# Patient Record
Sex: Female | Born: 1980 | Race: White | Hispanic: No | Marital: Married | Smoking: Current every day smoker
Health system: Southern US, Community
[De-identification: ages and names within clinical notes are randomized; demographics above are authoritative.]

## PROBLEM LIST (undated history)

## (undated) DIAGNOSIS — K8020 Calculus of gallbladder without cholecystitis without obstruction: Secondary | ICD-10-CM

## (undated) DIAGNOSIS — D649 Anemia, unspecified: Secondary | ICD-10-CM

## (undated) DIAGNOSIS — O009 Unspecified ectopic pregnancy without intrauterine pregnancy: Secondary | ICD-10-CM

## (undated) HISTORY — PX: CHOLECYSTECTOMY: SHX55

## (undated) HISTORY — DX: Calculus of gallbladder without cholecystitis without obstruction: K80.20

## (undated) HISTORY — PX: OTHER SURGICAL HISTORY: SHX169

## (undated) HISTORY — PX: TUBAL LIGATION: SHX77

---

## 2004-07-09 ENCOUNTER — Emergency Department: Payer: Self-pay | Admitting: Unknown Physician Specialty

## 2004-09-14 ENCOUNTER — Emergency Department: Payer: Self-pay | Admitting: Emergency Medicine

## 2005-01-26 ENCOUNTER — Emergency Department: Payer: Self-pay | Admitting: Unknown Physician Specialty

## 2005-05-11 ENCOUNTER — Emergency Department: Payer: Self-pay | Admitting: Emergency Medicine

## 2005-06-04 ENCOUNTER — Emergency Department: Payer: Self-pay | Admitting: General Practice

## 2005-08-10 ENCOUNTER — Emergency Department: Payer: Self-pay | Admitting: Emergency Medicine

## 2005-08-31 ENCOUNTER — Emergency Department: Payer: Self-pay | Admitting: Emergency Medicine

## 2005-10-24 ENCOUNTER — Emergency Department: Payer: Self-pay | Admitting: Emergency Medicine

## 2006-01-22 ENCOUNTER — Emergency Department: Payer: Self-pay | Admitting: Emergency Medicine

## 2006-02-16 ENCOUNTER — Emergency Department: Payer: Self-pay | Admitting: Emergency Medicine

## 2006-04-23 ENCOUNTER — Emergency Department: Payer: Self-pay | Admitting: Emergency Medicine

## 2006-05-18 ENCOUNTER — Emergency Department: Payer: Self-pay

## 2006-06-19 ENCOUNTER — Emergency Department: Payer: Self-pay | Admitting: Unknown Physician Specialty

## 2006-06-28 ENCOUNTER — Emergency Department: Payer: Self-pay | Admitting: Emergency Medicine

## 2006-07-04 ENCOUNTER — Emergency Department: Payer: Self-pay | Admitting: Emergency Medicine

## 2006-09-22 ENCOUNTER — Emergency Department: Payer: Self-pay | Admitting: Emergency Medicine

## 2007-01-31 ENCOUNTER — Emergency Department: Payer: Self-pay | Admitting: Emergency Medicine

## 2007-02-04 ENCOUNTER — Ambulatory Visit: Payer: Self-pay | Admitting: Emergency Medicine

## 2007-07-14 ENCOUNTER — Emergency Department: Payer: Self-pay | Admitting: Emergency Medicine

## 2008-04-02 ENCOUNTER — Emergency Department: Payer: Self-pay | Admitting: Emergency Medicine

## 2008-05-27 ENCOUNTER — Emergency Department: Payer: Self-pay | Admitting: Emergency Medicine

## 2008-05-27 ENCOUNTER — Emergency Department: Payer: Self-pay | Admitting: Internal Medicine

## 2008-09-26 ENCOUNTER — Emergency Department: Payer: Self-pay | Admitting: Emergency Medicine

## 2008-10-21 ENCOUNTER — Emergency Department: Payer: Self-pay | Admitting: Emergency Medicine

## 2008-10-28 ENCOUNTER — Emergency Department: Payer: Self-pay | Admitting: Emergency Medicine

## 2009-03-19 ENCOUNTER — Emergency Department: Payer: Self-pay | Admitting: Emergency Medicine

## 2009-05-03 ENCOUNTER — Emergency Department: Payer: Self-pay | Admitting: Emergency Medicine

## 2009-07-15 ENCOUNTER — Emergency Department: Payer: Self-pay | Admitting: Emergency Medicine

## 2009-09-06 ENCOUNTER — Emergency Department: Payer: Self-pay | Admitting: Emergency Medicine

## 2009-11-04 ENCOUNTER — Emergency Department: Payer: Self-pay | Admitting: Emergency Medicine

## 2009-12-13 ENCOUNTER — Emergency Department: Payer: Self-pay | Admitting: Internal Medicine

## 2010-03-30 ENCOUNTER — Emergency Department: Payer: Self-pay | Admitting: Emergency Medicine

## 2010-04-17 ENCOUNTER — Emergency Department: Payer: Self-pay | Admitting: Emergency Medicine

## 2010-05-04 ENCOUNTER — Emergency Department: Payer: Self-pay | Admitting: Unknown Physician Specialty

## 2010-05-12 ENCOUNTER — Emergency Department: Payer: Self-pay | Admitting: Emergency Medicine

## 2010-06-01 ENCOUNTER — Emergency Department: Payer: Self-pay | Admitting: Emergency Medicine

## 2010-06-07 ENCOUNTER — Emergency Department: Payer: Self-pay | Admitting: Emergency Medicine

## 2010-07-15 ENCOUNTER — Emergency Department: Payer: Self-pay | Admitting: Emergency Medicine

## 2010-10-19 ENCOUNTER — Emergency Department: Payer: Self-pay | Admitting: Emergency Medicine

## 2011-03-04 ENCOUNTER — Emergency Department: Payer: Self-pay | Admitting: Emergency Medicine

## 2011-03-11 ENCOUNTER — Emergency Department: Payer: Self-pay | Admitting: Emergency Medicine

## 2011-03-20 ENCOUNTER — Emergency Department: Payer: Self-pay | Admitting: Emergency Medicine

## 2011-06-19 ENCOUNTER — Emergency Department: Payer: Self-pay | Admitting: Emergency Medicine

## 2011-09-06 ENCOUNTER — Emergency Department: Payer: Self-pay | Admitting: Emergency Medicine

## 2011-09-22 ENCOUNTER — Emergency Department: Payer: Self-pay | Admitting: Emergency Medicine

## 2011-09-22 LAB — CBC
HCT: 35.6 % (ref 35.0–47.0)
MCH: 25.2 pg — ABNORMAL LOW (ref 26.0–34.0)
MCV: 77 fL — ABNORMAL LOW (ref 80–100)
Platelet: 322 10*3/uL (ref 150–440)
RDW: 15.6 % — ABNORMAL HIGH (ref 11.5–14.5)
WBC: 12.1 10*3/uL — ABNORMAL HIGH (ref 3.6–11.0)

## 2011-09-22 LAB — COMPREHENSIVE METABOLIC PANEL
Bilirubin,Total: 0.3 mg/dL (ref 0.2–1.0)
Calcium, Total: 9.1 mg/dL (ref 8.5–10.1)
Chloride: 103 mmol/L (ref 98–107)
Co2: 26 mmol/L (ref 21–32)
Creatinine: 0.83 mg/dL (ref 0.60–1.30)
EGFR (Non-African Amer.): >60
SGOT(AST): 15 U/L (ref 15–37)
SGPT (ALT): 15 U/L

## 2012-02-09 ENCOUNTER — Emergency Department: Payer: Self-pay | Admitting: Emergency Medicine

## 2012-02-29 ENCOUNTER — Emergency Department: Payer: Self-pay | Admitting: Internal Medicine

## 2012-05-03 ENCOUNTER — Emergency Department: Payer: Self-pay | Admitting: Emergency Medicine

## 2012-05-03 LAB — CBC
HCT: 34.6 % — ABNORMAL LOW (ref 35.0–47.0)
HGB: 11 g/dL — ABNORMAL LOW (ref 12.0–16.0)
MCHC: 31.8 g/dL — ABNORMAL LOW (ref 32.0–36.0)
Platelet: 298 10*3/uL (ref 150–440)
RBC: 4.55 10*6/uL (ref 3.80–5.20)
WBC: 10.9 10*3/uL (ref 3.6–11.0)

## 2012-05-03 LAB — COMPREHENSIVE METABOLIC PANEL
Albumin: 3.3 g/dL — ABNORMAL LOW (ref 3.4–5.0)
Alkaline Phosphatase: 120 U/L (ref 50–136)
Anion Gap: 6 — ABNORMAL LOW (ref 7–16)
BUN: 11 mg/dL (ref 7–18)
Calcium, Total: 8.6 mg/dL (ref 8.5–10.1)
Chloride: 110 mmol/L — ABNORMAL HIGH (ref 98–107)
Co2: 26 mmol/L (ref 21–32)
EGFR (African American): >60
Osmolality: 282 (ref 275–301)
Potassium: 3.8 mmol/L (ref 3.5–5.1)
SGOT(AST): 15 U/L (ref 15–37)
Sodium: 142 mmol/L (ref 136–145)

## 2012-06-15 ENCOUNTER — Emergency Department: Payer: Self-pay | Admitting: Emergency Medicine

## 2012-07-27 ENCOUNTER — Emergency Department: Payer: Self-pay | Admitting: Emergency Medicine

## 2012-08-17 ENCOUNTER — Emergency Department: Payer: Self-pay | Admitting: Emergency Medicine

## 2013-02-06 ENCOUNTER — Emergency Department: Payer: Self-pay

## 2013-02-21 ENCOUNTER — Emergency Department: Payer: Self-pay | Admitting: Emergency Medicine

## 2013-02-21 LAB — BASIC METABOLIC PANEL
BUN: 13 mg/dL (ref 7–18)
Chloride: 106 mmol/L (ref 98–107)
Co2: 26 mmol/L (ref 21–32)
Creatinine: 1.03 mg/dL (ref 0.60–1.30)
EGFR (African American): >60
Glucose: 97 mg/dL (ref 65–99)
Potassium: 4.2 mmol/L (ref 3.5–5.1)
Sodium: 140 mmol/L (ref 136–145)

## 2013-02-21 LAB — CBC
HGB: 11.1 g/dL — ABNORMAL LOW (ref 12.0–16.0)
MCH: 23.9 pg — ABNORMAL LOW (ref 26.0–34.0)
MCHC: 32.2 g/dL (ref 32.0–36.0)
Platelet: 294 10*3/uL (ref 150–440)
RBC: 4.67 10*6/uL (ref 3.80–5.20)
WBC: 11 10*3/uL (ref 3.6–11.0)

## 2013-02-21 LAB — TROPONIN I: Troponin-I: <0.02 ng/mL

## 2013-08-11 ENCOUNTER — Emergency Department: Payer: Self-pay | Admitting: Emergency Medicine

## 2013-09-14 ENCOUNTER — Emergency Department: Payer: Self-pay | Admitting: Emergency Medicine

## 2013-09-21 ENCOUNTER — Emergency Department: Payer: Self-pay | Admitting: Emergency Medicine

## 2013-11-01 ENCOUNTER — Emergency Department: Payer: Self-pay | Admitting: Emergency Medicine

## 2013-11-04 LAB — BETA STREP CULTURE(ARMC)

## 2014-02-24 ENCOUNTER — Emergency Department: Payer: Self-pay | Admitting: Emergency Medicine

## 2014-04-30 ENCOUNTER — Emergency Department: Payer: Self-pay | Admitting: Emergency Medicine

## 2014-07-06 ENCOUNTER — Emergency Department: Payer: Self-pay | Admitting: Emergency Medicine

## 2014-11-25 ENCOUNTER — Emergency Department
Admit: 2014-11-25 | Disposition: A | Discharge status: Home / Self Care | Payer: Self-pay | Admitting: Emergency Medicine

## 2014-12-04 ENCOUNTER — Emergency Department
Admit: 2014-12-04 | Disposition: A | Discharge status: Home / Self Care | Payer: Self-pay | Admitting: Emergency Medicine

## 2015-03-09 ENCOUNTER — Emergency Department
Admission: EM | Admit: 2015-03-09 | Discharge: 2015-03-09 | Disposition: A | Discharge status: Home / Self Care | Payer: Self-pay | Attending: Student | Admitting: Student

## 2015-03-09 DIAGNOSIS — B353 Tinea pedis: Secondary | ICD-10-CM | POA: Insufficient documentation

## 2015-03-09 MED ORDER — CLOTRIMAZOLE 1 % VA CREA: TOPICAL_CREAM | VAGINAL | Status: DC

## 2015-03-09 NOTE — ED Provider Notes (Signed)
[facility] Emergency Department Provider Note  ____________________________________________  Time seen: Approximately 6:06 AM  I have reviewed the triage vital signs and the nursing notes.   HISTORY  Chief Complaint Foot Pain    HPI Monique Jacobs is a 34 y.o. female with history of fibromyalgia presents for evaluation of pain and itching between her toes on her left foot. This has been ongoing for 1 week. It is had gradual onset, has been constant since onset. She thinks it may be athlete's foot as she has had this in the past. She has been using an over-the-counter cream for athlete's foot for the past 2 days however she reports no improvement in her symptoms. She denies any injury to the foot. Current severity of symptoms is moderate. No modifying factors. No associated symptoms.   No past medical history on file.  There are no active problems to display for this patient.   No past surgical history on file.  No current outpatient prescriptions on file.  Allergies Vicodin  No family history on file.  Social History History  Substance Use Topics  . Smoking status: Not on file  . Smokeless tobacco: Not on file  . Alcohol Use: Not on file    Review of Systems Constitutional: No fever/chills Eyes: No visual changes. ENT: No sore throat. Cardiovascular: Denies chest pain. Respiratory: Denies shortness of breath. Gastrointestinal: No abdominal pain.  No nausea, no vomiting.  No diarrhea.  No constipation. Genitourinary: Negative for dysuria. Musculoskeletal: Negative for back pain. Skin: Negative for rash. Neurological: Negative for headaches, focal weakness or numbness.  10-point ROS otherwise negative.  ____________________________________________   PHYSICAL EXAM:  VITAL SIGNS: ED Triage Vitals  Enc Vitals Group     BP 03/09/15 0532 142/62 mmHg     Pulse --      Resp 03/09/15 0532 20     Temp 03/09/15 0532 98 F (36.7 C)   Temp Source 03/09/15 0532 Oral     SpO2 03/09/15 0532 100 %     Weight 03/09/15 0532 240 lb (108.863 kg)     Height 03/09/15 0532 4\' 11"  (1.499 m)     Head Cir --      Peak Flow --      Pain Score 03/09/15 0533 7     Pain Loc --      Pain Edu? --      Excl. in GC? --     Constitutional: Alert and oriented. Well appearing and in no acute distress. Eyes: Conjunctivae are normal. PERRL. EOMI. Head: Atraumatic. Nose: No congestion/rhinnorhea. Mouth/Throat: Mucous membranes are moist.  Oropharynx non-erythematous. Neck: No stridor. Cardiovascular: Normal rate, regular rhythm. Grossly normal heart sounds.  Good peripheral circulation. 2+ DP pulses bilaterally. Respiratory: Normal respiratory effort.  No retractions. Lungs CTAB. Gastrointestinal: Soft and nontender. No distention. No abdominal bruits. No CVA tenderness. Genitourinary: deferred Musculoskeletal: No lower extremity tenderness nor edema.  No joint effusions. Neurologic:  Normal speech and language. No gross focal neurologic deficits are appreciated. No gait instability. Skin:  Areas of minor scaling in between all toes on the left foot Psychiatric: Mood and affect are normal. Speech and behavior are normal.  ____________________________________________   LABS (all labs ordered are listed, but only abnormal results are displayed)  Labs Reviewed - No data to display ____________________________________________  EKG  none ____________________________________________  RADIOLOGY  none ____________________________________________   PROCEDURES  Procedure(s) performed: None  Critical Care performed: No  ____________________________________________   INITIAL IMPRESSION /  ASSESSMENT AND PLAN / ED COURSE  Pertinent labs & imaging results that were available during my care of the patient were reviewed by me and considered in my medical decision making (see chart for details).  Monique Jacobs is a 34 y.o. female  with history of fibromyalgia presents for evaluation of pain and itching between her toes on her left foot. History and physical consistent with very mild athlete's foot/tinea pedis. No evidence to suggest superinfection. No neurovascular compromise and her exam is otherwise unremarkable. We'll discharge with topical clotrimazole, return precautions, close PCP follow-up. ____________________________________________   FINAL CLINICAL IMPRESSION(S) / ED DIAGNOSES  Final diagnoses:  Athlete's foot on left      Monique Doss, MD 03/09/15 [phone]

## 2015-03-09 NOTE — ED Notes (Signed)
Pt alert and in NAD at time of d/c to [facility] via w/c

## 2015-03-09 NOTE — ED Notes (Signed)
Pt c/o pain and itching to her left foot; says she has a small discolored area between her 2nd and 3rd digits; thinks it may be athlete's foot

## 2015-03-25 ENCOUNTER — Emergency Department: Payer: Self-pay

## 2015-03-25 ENCOUNTER — Emergency Department
Admission: EM | Admit: 2015-03-25 | Discharge: 2015-03-25 | Disposition: A | Discharge status: Home / Self Care | Payer: Self-pay | Attending: Emergency Medicine | Admitting: Emergency Medicine

## 2015-03-25 ENCOUNTER — Encounter: Payer: Self-pay | Admitting: Emergency Medicine

## 2015-03-25 DIAGNOSIS — K8020 Calculus of gallbladder without cholecystitis without obstruction: Secondary | ICD-10-CM | POA: Insufficient documentation

## 2015-03-25 DIAGNOSIS — Z720 Tobacco use: Secondary | ICD-10-CM | POA: Insufficient documentation

## 2015-03-25 DIAGNOSIS — R1011 Right upper quadrant pain: Secondary | ICD-10-CM

## 2015-03-25 HISTORY — DX: Unspecified ectopic pregnancy without intrauterine pregnancy: O00.90

## 2015-03-25 HISTORY — DX: Anemia, unspecified: D64.9

## 2015-03-25 LAB — URINALYSIS COMPLETE WITH MICROSCOPIC (ARMC ONLY)
BILIRUBIN URINE: NEGATIVE
GLUCOSE, UA: NEGATIVE mg/dL
HGB URINE DIPSTICK: NEGATIVE
KETONES UR: NEGATIVE mg/dL
Leukocytes, UA: NEGATIVE
Nitrite: NEGATIVE
PH: 6 (ref 5.0–8.0)
Protein, ur: NEGATIVE mg/dL
Specific Gravity, Urine: 1.01 (ref 1.005–1.030)

## 2015-03-25 LAB — COMPREHENSIVE METABOLIC PANEL
ALT: 10 U/L — ABNORMAL LOW (ref 14–54)
AST: 15 U/L (ref 15–41)
Albumin: 3.4 g/dL — ABNORMAL LOW (ref 3.5–5.0)
Alkaline Phosphatase: 108 U/L (ref 38–126)
Anion gap: 6 (ref 5–15)
BUN: 10 mg/dL (ref 6–20)
CO2: 27 mmol/L (ref 22–32)
CREATININE: 0.97 mg/dL (ref 0.44–1.00)
Calcium: 8.7 mg/dL — ABNORMAL LOW (ref 8.9–10.3)
Chloride: 100 mmol/L — ABNORMAL LOW (ref 101–111)
GFR calc Af Amer: >60 mL/min (ref >60)
GFR calc non Af Amer: >60 mL/min (ref >60)
Glucose, Bld: 111 mg/dL — ABNORMAL HIGH (ref 65–99)
Potassium: 4 mmol/L (ref 3.5–5.1)
Sodium: 133 mmol/L — ABNORMAL LOW (ref 135–145)
TOTAL PROTEIN: 7.1 g/dL (ref 6.5–8.1)
Total Bilirubin: <0.1 mg/dL — ABNORMAL LOW (ref 0.3–1.2)

## 2015-03-25 LAB — CBC
HEMATOCRIT: 30.2 % — AB (ref 35.0–47.0)
Hemoglobin: 9.4 g/dL — ABNORMAL LOW (ref 12.0–16.0)
MCH: 20.7 pg — AB (ref 26.0–34.0)
MCHC: 31.3 g/dL — ABNORMAL LOW (ref 32.0–36.0)
MCV: 66.3 fL — ABNORMAL LOW (ref 80.0–100.0)
Platelets: 353 10*3/uL (ref 150–440)
RBC: 4.55 MIL/uL (ref 3.80–5.20)
RDW: 20.1 % — ABNORMAL HIGH (ref 11.5–14.5)
WBC: 10 10*3/uL (ref 3.6–11.0)

## 2015-03-25 MED ORDER — OXYCODONE-ACETAMINOPHEN 5-325 MG PO TABS: 1.0000 | ORAL_TABLET | ORAL | Status: DC | PRN

## 2015-03-25 MED ORDER — ONDANSETRON HCL 4 MG PO TABS: 4.0000 mg | ORAL_TABLET | Freq: Every day | ORAL | Status: DC | PRN

## 2015-03-25 NOTE — ED Notes (Signed)
Pt reports R sided flank pain that started about 1 month ago.  Pt also reports not having normal bowel movements (only going once or twice every two weeks).  Pt visited her PCP and had ultrasound scheduled, but not done yet.  Pt reports pain increasing this morning when she woke up, despite taking percocet.

## 2015-03-25 NOTE — ED Notes (Signed)
NAD noted at time of D/C. Pt denies questions or concerns. Pt ambulatory to the lobby at this time.  

## 2015-03-25 NOTE — Discharge Instructions (Signed)
Please seek medical attention for any high fevers, chest pain, shortness of breath, change in behavior, persistent vomiting, bloody stool or any other new or concerning symptoms. ° °Cholelithiasis °Cholelithiasis (also called gallstones) is a form of gallbladder disease in which gallstones form in your gallbladder. The gallbladder is an organ that stores bile made in the liver, which helps digest fats. Gallstones begin as small crystals and slowly grow into stones. Gallstone pain occurs when the gallbladder spasms and a gallstone is blocking the duct. Pain can also occur when a stone passes out of the duct.  °RISK FACTORS °· Being female.   °· Having multiple pregnancies. Health care providers sometimes advise removing diseased gallbladders before future pregnancies.   °· Being obese. °· Eating a diet heavy in fried foods and fat.   °· Being older than 60 years and increasing age.   °· Prolonged use of medicines containing female hormones.   °· Having diabetes mellitus.   °· Rapidly losing weight.   °· Having a family history of gallstones (heredity).   °SYMPTOMS °· Nausea.   °· Vomiting. °· Abdominal pain.   °· Yellowing of the skin (jaundice).   °· Sudden pain. It may persist from several minutes to several hours. °· Fever.   °· Tenderness to the touch.  °In some cases, when gallstones do not move into the bile duct, people have no pain or symptoms. These are called "silent" gallstones.  °TREATMENT °Silent gallstones do not need treatment. In severe cases, emergency surgery may be required. Options for treatment include: °· Surgery to remove the gallbladder. This is the most common treatment. °· Medicines. These do not always work and may take 6-12 months or more to work. °· Shock wave treatment (extracorporeal biliary lithotripsy). In this treatment an ultrasound machine sends shock waves to the gallbladder to break gallstones into smaller pieces that can pass into the intestines or be dissolved by  medicine. °HOME CARE INSTRUCTIONS  °· Only take over-the-counter or prescription medicines for pain, discomfort, or fever as directed by your health care provider.   °· Follow a low-fat diet until seen again by your health care provider. Fat causes the gallbladder to contract, which can result in pain.   °· Follow up with your health care provider as directed. Attacks are almost always recurrent and surgery is usually required for permanent treatment.   °SEEK IMMEDIATE MEDICAL CARE IF:  °· Your pain increases and is not controlled by medicines.   °· You have a fever or persistent symptoms for more than 2-3 days.   °· You have a fever and your symptoms suddenly get worse.   °· You have persistent nausea and vomiting.   °MAKE SURE YOU:  °· Understand these instructions. °· Will watch your condition. °· Will get help right away if you are not doing well or get worse. °Document Released: 08/04/2005 Document Revised: 04/10/2013 Document Reviewed: 01/30/2013 °Exit[employer]® Patient Information ©2015 [employer]. This information is not intended to replace advice given to you by your health care provider. Make sure you discuss any questions you have with your health care provider. ° °

## 2015-03-25 NOTE — ED Notes (Signed)
Pt here with right sided abdominal pain, reports seen by PCP and they were going to schedule [place], if pain got worse, was told to come here.

## 2015-03-25 NOTE — ED Provider Notes (Signed)
[facility] Emergency Department Provider Note    ____________________________________________  Time seen: 1400  I have reviewed the triage vital signs and the nursing notes.   HISTORY  Chief Complaint Flank Pain   History limited by: Not Limited   HPI Monique Jacobs is a 34 y.o. female who presents to the emergency department today because of concerns for continued right-sided pain. She states the pain is been going on for 1 month. Has been constant. It is worse with walking. It is located in the right flank. It is sharp and old. She does have associated nausea. She hasn't additionally has associated difficulty with defecation. She has not noticed any bloody stool. No fevers She has seen her primary care provider who was going to order an ultrasound for her however this is not it happened.     Past Medical History  Diagnosis Date  . Ectopic pregnancy   . Anemia     There are no active problems to display for this patient.   Past Surgical History  Procedure Laterality Date  . Tubal ligation    . Cessariatn section      Current Outpatient Rx  Name  Route  Sig  Dispense  Refill  . clotrimazole (GYNE-LOTRIMIN) 1 % vaginal cream      Apply clotrimazole 1% cream to affected areas (web spaces/between toes) twice daily for 4 weeks for treatment of athlete's foot. Pharmacist dispense QS. No refills.   45 g   0     Allergies Bactrim and Vicodin  No family history on file.  Social History History  Substance Use Topics  . Smoking status: Current Every Day Smoker -- 1.00 packs/day    Types: Cigarettes  . Smokeless tobacco: Not on file  . Alcohol Use: 0.6 oz/week    1 Standard drinks or equivalent per week    Review of Systems  Constitutional: Negative for fever. Cardiovascular: Negative for chest pain. Respiratory: Negative for shortness of breath. Gastrointestinal: Right-sided abdominal pain plus nausea Genitourinary: Negative for  dysuria. Musculoskeletal: Negative for back pain. Skin: Negative for rash. Neurological: Negative for headaches, focal weakness or numbness.   10-point ROS otherwise negative.  ____________________________________________   PHYSICAL EXAM:  VITAL SIGNS: ED Triage Vitals  Enc Vitals Group     BP 03/25/15 1231 102/59 mmHg     Pulse Rate 03/25/15 1231 93     Resp 03/25/15 1231 20     Temp 03/25/15 1231 97.7 F (36.5 C)     Temp Source 03/25/15 1231 Oral     SpO2 03/25/15 1231 100 %     Weight 03/25/15 1231 235 lb (106.595 kg)     Height 03/25/15 1231 4\' 11"  (1.499 m)     Head Cir --      Peak Flow --      Pain Score 03/25/15 1231 5   Constitutional: Alert and oriented. Well appearing and in no distress. Eyes: Conjunctivae are normal. PERRL. Normal extraocular movements. ENT   Head: Normocephalic and atraumatic.   Nose: No congestion/rhinnorhea.   Mouth/Throat: Mucous membranes are moist.   Neck: No stridor. Cardiovascular: Normal rate, regular rhythm.   Respiratory: Normal respiratory effort without tachypnea nor retractions.  Gastrointestinal: Soft and moderately tender in the right upper quadrant. No rebound. No guarding. Genitourinary: Deferred Musculoskeletal: Normal range of motion in all extremities. No joint effusions.  No lower extremity tenderness nor edema. Neurologic:  Normal speech and language. No gross focal neurologic deficits are appreciated. Speech  is normal.  Skin:  Skin is warm, dry and intact. No rash noted. Psychiatric: Mood and affect are normal. Speech and behavior are normal. Patient exhibits appropriate insight and judgment.  ____________________________________________    LABS (pertinent positives/negatives)  Labs Reviewed  COMPREHENSIVE METABOLIC PANEL - Abnormal; Notable for the following:    Sodium 133 (*)    Chloride 100 (*)    Glucose, Bld 111 (*)    Calcium 8.7 (*)    Albumin 3.4 (*)    ALT 10 (*)    Total Bilirubin  <0.1 (*)    All other components within normal limits  CBC - Abnormal; Notable for the following:    Hemoglobin 9.4 (*)    HCT 30.2 (*)    MCV 66.3 (*)    MCH 20.7 (*)    MCHC 31.3 (*)    RDW 20.1 (*)    All other components within normal limits  URINALYSIS COMPLETEWITH MICROSCOPIC ([facility] ONLY) - Abnormal; Notable for the following:    Color, Urine YELLOW (*)    APPearance CLEAR (*)    Bacteria, UA RARE (*)    Squamous Epithelial / LPF 0-5 (*)    All other components within normal limits     ____________________________________________   EKG  None  ____________________________________________    RADIOLOGY  RUQ US  IMPRESSION: Cholelithiasis. Difficult to confirm or exclude acute cholecystitis, but no pericholecystic fluid or gallbladder wall thickening.  Hepatic steatosis.  ____________________________________________   PROCEDURES  Procedure(s) performed: None  Critical Care performed: No  ____________________________________________   INITIAL IMPRESSION / ASSESSMENT AND PLAN / ED COURSE  Pertinent labs & imaging results that were available during my care of the patient were reviewed by me and considered in my medical decision making (see chart for details).  Patient presents to the emergency department today with right-sided abdominal pain. Ultrasound does show gallstones without any signs of acute cholecystitis. Blood work without any concerning findings. Will discharge home with surgery follow-up.  ____________________________________________   FINAL CLINICAL IMPRESSION(S) / ED DIAGNOSES  Cholelithiasis  Phineas Semen, MD 03/25/15 1526

## 2015-05-13 ENCOUNTER — Emergency Department
Admission: EM | Admit: 2015-05-13 | Discharge: 2015-05-13 | Disposition: A | Discharge status: Home / Self Care | Payer: Self-pay | Attending: Emergency Medicine | Admitting: Emergency Medicine

## 2015-05-13 DIAGNOSIS — R110 Nausea: Secondary | ICD-10-CM | POA: Insufficient documentation

## 2015-05-13 DIAGNOSIS — K047 Periapical abscess without sinus: Secondary | ICD-10-CM | POA: Insufficient documentation

## 2015-05-13 DIAGNOSIS — K0381 Cracked tooth: Secondary | ICD-10-CM | POA: Insufficient documentation

## 2015-05-13 DIAGNOSIS — Z792 Long term (current) use of antibiotics: Secondary | ICD-10-CM | POA: Insufficient documentation

## 2015-05-13 DIAGNOSIS — K0889 Other specified disorders of teeth and supporting structures: Secondary | ICD-10-CM

## 2015-05-13 DIAGNOSIS — Z79899 Other long term (current) drug therapy: Secondary | ICD-10-CM | POA: Insufficient documentation

## 2015-05-13 DIAGNOSIS — Z720 Tobacco use: Secondary | ICD-10-CM | POA: Insufficient documentation

## 2015-05-13 MED ORDER — CEPHALEXIN 500 MG PO CAPS
ORAL_CAPSULE | ORAL | Status: AC
  Administered 2015-05-13: 500 mg via ORAL
  Filled 2015-05-13: qty 1

## 2015-05-13 MED ORDER — CEPHALEXIN 500 MG PO CAPS: 500.0000 mg | ORAL_CAPSULE | Freq: Four times a day (QID) | ORAL | Status: AC

## 2015-05-13 MED ORDER — CEPHALEXIN 500 MG PO CAPS
500.0000 mg | ORAL_CAPSULE | Freq: Once | ORAL | Status: AC
  Administered 2015-05-13: 500 mg via ORAL

## 2015-05-13 MED ORDER — IBUPROFEN 800 MG PO TABS
800.0000 mg | ORAL_TABLET | Freq: Once | ORAL | Status: AC
Start: 2015-05-13 — End: 2015-05-13
  Administered 2015-05-13: 800 mg via ORAL
  Filled 2015-05-13: qty 1

## 2015-05-13 MED ORDER — TRAMADOL HCL 50 MG PO TABS: 50.0000 mg | ORAL_TABLET | Freq: Four times a day (QID) | ORAL | Status: AC | PRN

## 2015-05-13 NOTE — Discharge Instructions (Signed)
Dental Abscess °A dental abscess is a collection of infected fluid (pus) from a bacterial infection in the inner part of the tooth (pulp). It usually occurs at the end of the tooth's root.  °CAUSES  °· Severe tooth decay. °· Trauma to the tooth that allows bacteria to enter into the pulp, such as a broken or chipped tooth. °SYMPTOMS  °· Severe pain in and around the infected tooth. °· Swelling and redness around the abscessed tooth or in the mouth or face. °· Tenderness. °· Pus drainage. °· Bad breath. °· Bitter taste in the mouth. °· Difficulty swallowing. °· Difficulty opening the mouth. °· Nausea. °· Vomiting. °· Chills. °· Swollen neck glands. °DIAGNOSIS  °· A medical and dental history will be taken. °· An examination will be performed by tapping on the abscessed tooth. °· X-rays may be taken of the tooth to identify the abscess. °TREATMENT °The goal of treatment is to eliminate the infection. You may be prescribed antibiotic medicine to stop the infection from spreading. A root canal may be performed to save the tooth. If the tooth cannot be saved, it may be pulled (extracted) and the abscess may be drained.  °HOME CARE INSTRUCTIONS °· Only take over-the-counter or prescription medicines for pain, fever, or discomfort as directed by your caregiver. °· Rinse your mouth (gargle) often with salt water (¼ tsp salt in 8 oz [250 ml] of warm water) to relieve pain or swelling. °· Do not drive after taking pain medicine (narcotics). °· Do not apply heat to the outside of your face. °· Return to your dentist for further treatment as directed. °SEEK MEDICAL CARE IF: °· Your pain is not helped by medicine. °· Your pain is getting worse instead of better. °SEEK IMMEDIATE MEDICAL CARE IF: °· You have a fever or persistent symptoms for more than 2-3 days. °· You have a fever and your symptoms suddenly get worse. °· You have chills or a very bad headache. °· You have problems breathing or swallowing. °· You have trouble  opening your mouth. °· You have swelling in the neck or around the eye. °Document Released: 08/08/2005 Document Revised: 05/02/2012 Document Reviewed: 11/16/2010 °Exit[employer]® Patient Information ©2015 [employer]. This information is not intended to replace advice given to you by your health care provider. Make sure you discuss any questions you have with your health care provider. °Dental Pain °A tooth ache may be caused by cavities (tooth decay). Cavities expose the nerve of the tooth to air and hot or cold temperatures. It may come from an infection or abscess (also called a boil or furuncle) around your tooth. It is also often caused by dental caries (tooth decay). This causes the pain you are having. °DIAGNOSIS  °Your caregiver can diagnose this problem by exam. °TREATMENT  °· If caused by an infection, it may be treated with medications which kill germs (antibiotics) and pain medications as prescribed by your caregiver. Take medications as directed. °· Only take over-the-counter or prescription medicines for pain, discomfort, or fever as directed by your caregiver. °· Whether the tooth ache today is caused by infection or dental disease, you should see your dentist as soon as possible for further care. °SEEK MEDICAL CARE IF: °The exam and treatment you received today has been provided on an emergency basis only. This is not a substitute for complete medical or dental care. If your problem worsens or new problems (symptoms) appear, and you are unable to meet with your dentist, call or return   to this location. °SEEK IMMEDIATE MEDICAL CARE IF:  °· You have a fever. °· You develop redness and swelling of your face, jaw, or neck. °· You are unable to open your mouth. °· You have severe pain uncontrolled by pain medicine. °MAKE SURE YOU:  °· Understand these instructions. °· Will watch your condition. °· Will get help right away if you are not doing well or get worse. °Document Released: 08/08/2005 Document Revised:  10/31/2011 Document Reviewed: 03/26/2008 °[employer]® Patient Information ©2015 [employer]. This information is not intended to replace advice given to you by your health care provider. Make sure you discuss any questions you have with your health care provider. ° °

## 2015-05-13 NOTE — ED Notes (Signed)
Pt in with co toothache and right sided facial swelling and pain.

## 2015-05-13 NOTE — ED Provider Notes (Signed)
[facility] Emergency Department Provider Note  ____________________________________________  Time seen: Approximately 5:26 AM  I have reviewed the triage vital signs and the nursing notes.   HISTORY  Chief Complaint Dental Pain    HPI Monique Jacobs is a 34 y.o. female who comes into the hospital with a tooth infection. She reports that she has some swelling in her right face. The patient reports that this started at 316 yesterday morning. She reports that she was not able to follow up with her primary care physician because she had to work. She took 2 Percocet and the Tylenol. The patient reports that it helped a little but the swelling has returned. The patient reports that she is also unable to open her mouth again. The patient also reports that she took 2 amoxicillin pills. The patient has a broken tooth on her right maxilla and reports that the tooth is very painful. She has had previously broken teeth which have been removed from her dentist. The patient has not called her dentist but plans to call them today. The patient denies any fever but has some nausea. She reports that she's also had some gallstones in the recent past from which she's had the medication.The patient reports her pain as an 8 out of 10 in intensity.   Past Medical History  Diagnosis Date  . Ectopic pregnancy   . Anemia     There are no active problems to display for this patient.   Past Surgical History  Procedure Laterality Date  . Tubal ligation    . Cessariatn section      Current Outpatient Rx  Name  Route  Sig  Dispense  Refill  . cephALEXin (KEFLEX) 500 MG capsule   Oral   Take 1 capsule (500 mg total) by mouth 4 (four) times daily.   20 capsule   0   . clotrimazole (GYNE-LOTRIMIN) 1 % vaginal cream      Apply clotrimazole 1% cream to affected areas (web spaces/between toes) twice daily for 4 weeks for treatment of athlete's foot. Pharmacist dispense QS. No  refills.   45 g   0   . ondansetron (ZOFRAN) 4 MG tablet   Oral   Take 1 tablet (4 mg total) by mouth daily as needed for nausea or vomiting.   20 tablet   0   . oxyCODONE-acetaminophen (ROXICET) 5-325 MG per tablet   Oral   Take 1 tablet by mouth every 4 (four) hours as needed for severe pain.   15 tablet   0   . traMADol (ULTRAM) 50 MG tablet   Oral   Take 1 tablet (50 mg total) by mouth every 6 (six) hours as needed.   12 tablet   0     Allergies Bactrim and Vicodin  No family history on file.  Social History Social History  Substance Use Topics  . Smoking status: Current Every Day Smoker -- 1.00 packs/day    Types: Cigarettes  . Smokeless tobacco: Not on file  . Alcohol Use: 0.6 oz/week    1 Thermon Zulauf drinks or equivalent per week    Review of Systems Constitutional: No fever/chills Eyes: No visual changes. ENT: Right-sided dental pain and facial swelling Cardiovascular: Denies chest pain. Respiratory: Denies shortness of breath. Gastrointestinal: No abdominal pain.  No nausea, no vomiting.  No diarrhea.  No constipation. Genitourinary: Negative for dysuria. Musculoskeletal: Negative for back pain. Skin: Negative for rash. Neurological: Negative for headaches, focal weakness or numbness.  10-point ROS otherwise negative.  ____________________________________________   PHYSICAL EXAM:  VITAL SIGNS: ED Triage Vitals  Enc Vitals Group     BP 05/13/15 0052 183/55 mmHg     Pulse Rate 05/13/15 0052 89     Resp 05/13/15 0052 18     Temp 05/13/15 0052 97.9 F (36.6 C)     Temp Source 05/13/15 0052 Oral     SpO2 05/13/15 0052 100 %     Weight 05/13/15 0052 235 lb (106.595 kg)     Height 05/13/15 0052  (1.499 m)     Head Cir --      Peak Flow --      Pain Score 05/13/15 0053 8     Pain Loc --      Pain Edu? --      Excl. in GC? --     Constitutional: Alert and oriented. Well appearing and in no acute distress. Eyes: Conjunctivae are normal.  PERRL. EOMI. Head: Atraumatic. Nose: No congestion/rhinnorhea. Mouth/Throat: Broken tooth at the right sided premolar with exquisite tenderness to palpation. Mucous membranes are moist.  Oropharynx non-erythematous. Cardiovascular: Normal rate, regular rhythm. Grossly normal heart sounds.  Good peripheral circulation. Respiratory: Normal respiratory effort.  No retractions. Lungs CTAB. Gastrointestinal: Soft and nontender. No distention. Positive bowel sounds Musculoskeletal: No lower extremity tenderness nor edema. . Neurologic:  Normal speech and language.  Skin:  Skin is warm, dry and intact.  Psychiatric: Mood and affect are normal.   ____________________________________________   LABS (all labs ordered are listed, but only abnormal results are displayed)  Labs Reviewed - No data to display ____________________________________________  EKG  None ____________________________________________  RADIOLOGY  None ____________________________________________   PROCEDURES  Procedure(s) performed: None  Critical Care performed: No  ____________________________________________   INITIAL IMPRESSION / ASSESSMENT AND PLAN / ED COURSE  Pertinent labs & imaging results that were available during my care of the patient were reviewed by me and considered in my medical decision making (see chart for details).  The patient is a 34 year old female who comes in today with dental pain. The patient does have some tenderness to palpation at her right sided premolar. I will give the patient a dose of ibuprofen and she is driving as well as some Keflex. I encouraged the patient to follow-up with her dentist and have her tooth pulled that she is very exquisitely tender on that tooth. The patient has no definitive abscess but does have some mild right-sided facial swelling. I explained this to the patient and she understands plan as stated. ____________________________________________   FINAL  CLINICAL IMPRESSION(S) / ED DIAGNOSES  Final diagnoses:  Pain, dental  Dental abscess      Rebecka Apley, MD 05/13/15 [phone]

## 2015-05-13 NOTE — ED Notes (Signed)
Pt sitting in room with no distress noted; pt reports right upper toothache since yesterday, now with swelling to jaw; rates 5/10 at present but st pain increases with palpation

## 2015-05-27 ENCOUNTER — Encounter: Payer: Self-pay | Admitting: *Deleted

## 2015-05-27 DIAGNOSIS — Z3202 Encounter for pregnancy test, result negative: Secondary | ICD-10-CM | POA: Insufficient documentation

## 2015-05-27 DIAGNOSIS — Z720 Tobacco use: Secondary | ICD-10-CM | POA: Insufficient documentation

## 2015-05-27 DIAGNOSIS — Z79899 Other long term (current) drug therapy: Secondary | ICD-10-CM | POA: Insufficient documentation

## 2015-05-27 DIAGNOSIS — K8050 Calculus of bile duct without cholangitis or cholecystitis without obstruction: Secondary | ICD-10-CM | POA: Insufficient documentation

## 2015-05-27 DIAGNOSIS — E669 Obesity, unspecified: Secondary | ICD-10-CM | POA: Insufficient documentation

## 2015-05-27 DIAGNOSIS — Z791 Long term (current) use of non-steroidal anti-inflammatories (NSAID): Secondary | ICD-10-CM | POA: Insufficient documentation

## 2015-05-27 LAB — CBC
HCT: 33 % — ABNORMAL LOW (ref 35.0–47.0)
Hemoglobin: 10.2 g/dL — ABNORMAL LOW (ref 12.0–16.0)
MCH: 20.4 pg — ABNORMAL LOW (ref 26.0–34.0)
MCHC: 30.9 g/dL — ABNORMAL LOW (ref 32.0–36.0)
MCV: 65.9 fL — AB (ref 80.0–100.0)
PLATELETS: 394 10*3/uL (ref 150–440)
RBC: 5.01 MIL/uL (ref 3.80–5.20)
RDW: 19.4 % — ABNORMAL HIGH (ref 11.5–14.5)
WBC: 14.7 10*3/uL — ABNORMAL HIGH (ref 3.6–11.0)

## 2015-05-27 LAB — COMPREHENSIVE METABOLIC PANEL
ALT: 11 U/L — AB (ref 14–54)
AST: 16 U/L (ref 15–41)
Albumin: 3.8 g/dL (ref 3.5–5.0)
Alkaline Phosphatase: 126 U/L (ref 38–126)
Anion gap: 9 (ref 5–15)
BUN: 9 mg/dL (ref 6–20)
CHLORIDE: 104 mmol/L (ref 101–111)
CO2: 25 mmol/L (ref 22–32)
CREATININE: 0.8 mg/dL (ref 0.44–1.00)
Calcium: 9.2 mg/dL (ref 8.9–10.3)
GFR calc non Af Amer: >60 mL/min (ref >60)
Glucose, Bld: 94 mg/dL (ref 65–99)
Potassium: 3.8 mmol/L (ref 3.5–5.1)
SODIUM: 138 mmol/L (ref 135–145)
Total Bilirubin: 0.3 mg/dL (ref 0.3–1.2)
Total Protein: 7.9 g/dL (ref 6.5–8.1)

## 2015-05-27 LAB — LIPASE, BLOOD: LIPASE: 19 U/L — AB (ref 22–51)

## 2015-05-27 LAB — URINALYSIS COMPLETE WITH MICROSCOPIC (ARMC ONLY)
BILIRUBIN URINE: NEGATIVE
Glucose, UA: NEGATIVE mg/dL
Hgb urine dipstick: NEGATIVE
Ketones, ur: NEGATIVE mg/dL
Leukocytes, UA: NEGATIVE
Nitrite: NEGATIVE
PH: 6 (ref 5.0–8.0)
PROTEIN: NEGATIVE mg/dL
Specific Gravity, Urine: 1.004 — ABNORMAL LOW (ref 1.005–1.030)
WBC, UA: NONE SEEN WBC/hpf (ref 0–5)

## 2015-05-27 LAB — POCT PREGNANCY, URINE: PREG TEST UR: NEGATIVE

## 2015-05-27 NOTE — ED Notes (Signed)
Pt reports right sided abdominal and flank pain since this afternoon. Nausea and vomiting.  Reported history of gallstones.

## 2015-05-28 ENCOUNTER — Emergency Department
Admission: EM | Admit: 2015-05-28 | Discharge: 2015-05-28 | Disposition: A | Discharge status: Home / Self Care | Payer: Self-pay | Attending: Emergency Medicine | Admitting: Emergency Medicine

## 2015-05-28 ENCOUNTER — Emergency Department: Payer: Self-pay

## 2015-05-28 DIAGNOSIS — K8050 Calculus of bile duct without cholangitis or cholecystitis without obstruction: Secondary | ICD-10-CM

## 2015-05-28 MED ORDER — OXYCODONE-ACETAMINOPHEN 5-325 MG PO TABS: 1.0000 | ORAL_TABLET | ORAL | Status: DC | PRN

## 2015-05-28 MED ORDER — OXYCODONE-ACETAMINOPHEN 5-325 MG PO TABS
1.0000 | ORAL_TABLET | Freq: Once | ORAL | Status: AC
  Administered 2015-05-28: 1 via ORAL
  Filled 2015-05-28: qty 1

## 2015-05-28 MED ORDER — PROMETHAZINE HCL 25 MG PO TABS: 25.0000 mg | ORAL_TABLET | Freq: Four times a day (QID) | ORAL | Status: DC | PRN

## 2015-05-28 MED ORDER — ONDANSETRON 4 MG PO TBDP
4.0000 mg | ORAL_TABLET | Freq: Once | ORAL | Status: AC
  Administered 2015-05-28: 4 mg via ORAL
  Filled 2015-05-28: qty 1

## 2015-05-28 MED ORDER — OXYCODONE-ACETAMINOPHEN 7.5-325 MG PO TABS: 1.0000 | ORAL_TABLET | Freq: Once | ORAL | Status: DC

## 2015-05-28 NOTE — Discharge Instructions (Signed)
1. Take medicines as needed for pain & nausea (Percocet/Phenergan #20). 2. Clear liquids x 12 hours, then bland diet x 1 week, then slowly advance diet as tolerated. Avoid fatty, greasy, spicy foods and drinks. 3. Return to the ER for worsening symptoms, persistent vomiting, fever, difficulty breathing or other concerns.  Cholelithiasis Cholelithiasis (also called gallstones) is a form of gallbladder disease in which gallstones form in your gallbladder. The gallbladder is an organ that stores bile made in the liver, which helps digest fats. Gallstones begin as small crystals and slowly grow into stones. Gallstone pain occurs when the gallbladder spasms and a gallstone is blocking the duct. Pain can also occur when a stone passes out of the duct.  RISK FACTORS  Being female.   Having multiple pregnancies. Health care providers sometimes advise removing diseased gallbladders before future pregnancies.   Being obese.  Eating a diet heavy in fried foods and fat.   Being older than 60 years and increasing age.   Prolonged use of medicines containing female hormones.   Having diabetes mellitus.   Rapidly losing weight.   Having a family history of gallstones (heredity).  SYMPTOMS  Nausea.   Vomiting.  Abdominal pain.   Yellowing of the skin (jaundice).   Sudden pain. It may persist from several minutes to several hours.  Fever.   Tenderness to the touch. In some cases, when gallstones do not move into the bile duct, people have no pain or symptoms. These are called "silent" gallstones.  TREATMENT Silent gallstones do not need treatment. In severe cases, emergency surgery may be required. Options for treatment include:  Surgery to remove the gallbladder. This is the most common treatment.  Medicines. These do not always work and may take 6-12 months or more to work.  Shock wave treatment (extracorporeal biliary lithotripsy). In this treatment an ultrasound  machine sends shock waves to the gallbladder to break gallstones into smaller pieces that can pass into the intestines or be dissolved by medicine. HOME CARE INSTRUCTIONS   Only take over-the-counter or prescription medicines for pain, discomfort, or fever as directed by your health care provider.   Follow a low-fat diet until seen again by your health care provider. Fat causes the gallbladder to contract, which can result in pain.   Follow up with your health care provider as directed. Attacks are almost always recurrent and surgery is usually required for permanent treatment.  SEEK IMMEDIATE MEDICAL CARE IF:   Your pain increases and is not controlled by medicines.   You have a fever or persistent symptoms for more than 2-3 days.   You have a fever and your symptoms suddenly get worse.   You have persistent nausea and vomiting.  MAKE SURE YOU:   Understand these instructions.  Will watch your condition.  Will get help right away if you are not doing well or get worse.   This information is not intended to replace advice given to you by your health care provider. Make sure you discuss any questions you have with your health care provider.   Document Released: 08/04/2005 Document Revised: 04/10/2013 Document Reviewed: 01/30/2013 Elsevier Interactive Patient Education [employer]

## 2015-05-28 NOTE — ED Notes (Signed)
Pt returns to room via stretcher; st pain decreased to 4/10

## 2015-05-28 NOTE — ED Provider Notes (Signed)
[facility] Emergency Department Provider Note  ____________________________________________  Time seen: Approximately 1:27 AM  I have reviewed the triage vital signs and the nursing notes.   HISTORY  Chief Complaint Abdominal Pain    HPI Monique Jacobs is a 34 y.o. female who presents to the ED from home with a chief complain of abdominal pain. Patient has known gallstones; ate pork biscuit for breakfast, began to have onset of right upper quadrant abdominal pain approximately 3:30 PM. Describes waxing/waning aching type pain which radiates to her right flank. Associated with single episode of nausea and vomiting. Denies fever, chills, chest pain, shortness of breath, dysuria. Nothing makes the pain better or worse. States she followed up at [facility] but they have yet to schedule her surgery.  Past Medical History  Diagnosis Date  . Ectopic pregnancy   . Anemia     There are no active problems to display for this patient.   Past Surgical History  Procedure Laterality Date  . Tubal ligation    . Cessariatn section      Current Outpatient Rx  Name  Route  Sig  Dispense  Refill  . docusate sodium (COLACE) 100 MG capsule   Oral   Take 100 mg by mouth 2 (two) times daily.         Marland Kitchen ibuprofen (ADVIL,MOTRIN) 800 MG tablet   Oral   Take 800 mg by mouth 3 (three) times daily.         Marland Kitchen senna (SENOKOT) 8.6 MG tablet   Oral   Take 2 tablets by mouth daily.         . traMADol (ULTRAM) 50 MG tablet   Oral   Take 1 tablet (50 mg total) by mouth every 6 (six) hours as needed.   12 tablet   0   . clotrimazole (GYNE-LOTRIMIN) 1 % vaginal cream      Apply clotrimazole 1% cream to affected areas (web spaces/between toes) twice daily for 4 weeks for treatment of athlete's foot. Pharmacist dispense QS. No refills. Patient not taking: Reported on 05/28/2015   45 g   0   . ondansetron (ZOFRAN) 4 MG tablet   Oral   Take 1 tablet (4 mg total) by mouth  daily as needed for nausea or vomiting. Patient not taking: Reported on 05/28/2015   20 tablet   0   . oxyCODONE-acetaminophen (ROXICET) 5-325 MG per tablet   Oral   Take 1 tablet by mouth every 4 (four) hours as needed for severe pain. Patient not taking: Reported on 05/28/2015   15 tablet   0     Allergies Bactrim; Baclofen; Sumatriptan; and Vicodin  No family history on file.  Social History Social History  Substance Use Topics  . Smoking status: Current Every Day Smoker -- 1.00 packs/day    Types: Cigarettes  . Smokeless tobacco: None  . Alcohol Use: 0.6 oz/week    1 Standard drinks or equivalent per week    Review of Systems Constitutional: No fever/chills Eyes: No visual changes. ENT: No sore throat. Cardiovascular: Denies chest pain. Respiratory: Denies shortness of breath. Gastrointestinal: Positive for abdominal pain.  Positive for nausea and vomiting.  No diarrhea.  No constipation. Positive for right flank pain. Genitourinary: Negative for dysuria. Musculoskeletal: Negative for back pain. Skin: Negative for rash. Neurological: Negative for headaches, focal weakness or numbness.  10-point ROS otherwise negative.  ____________________________________________   PHYSICAL EXAM:  VITAL SIGNS: ED Triage Vitals  Enc Vitals  Group     BP 05/27/15 2119 112/58 mmHg     Pulse Rate 05/27/15 2119 77     Resp 05/27/15 2119 18     Temp 05/27/15 2119 98.2 F (36.8 C)     Temp Source 05/27/15 2119 Oral     SpO2 05/27/15 2119 100 %     Weight 05/27/15 2119 240 lb (108.863 kg)     Height 05/27/15 2119 4\' 11"  (1.499 m)     Head Cir --      Peak Flow --      Pain Score 05/27/15 2119 7     Pain Loc --      Pain Edu? --      Excl. in GC? --     Constitutional: Alert and oriented. Well appearing and in no acute distress. Eyes: Conjunctivae are normal. PERRL. EOMI. Head: Atraumatic. Nose: No congestion/rhinnorhea. Mouth/Throat: Mucous membranes are moist.   Oropharynx non-erythematous. Neck: No stridor.   Cardiovascular: Normal rate, regular rhythm. Grossly normal heart sounds.  Good peripheral circulation. Respiratory: Normal respiratory effort.  No retractions. Lungs CTAB. Gastrointestinal: Obese. Soft and mildly tender to palpation right upper quadrant without rebound or guarding. No distention. No abdominal bruits. No CVA tenderness. Musculoskeletal: No lower extremity tenderness nor edema.  No joint effusions. Neurologic:  Normal speech and language. No gross focal neurologic deficits are appreciated. No gait instability. Skin:  Skin is warm, dry and intact. No rash noted. Psychiatric: Mood and affect are normal. Speech and behavior are normal.  ____________________________________________   LABS (all labs ordered are listed, but only abnormal results are displayed)  Labs Reviewed  LIPASE, BLOOD - Abnormal; Notable for the following:    Lipase 19 (*)    All other components within normal limits  COMPREHENSIVE METABOLIC PANEL - Abnormal; Notable for the following:    ALT 11 (*)    All other components within normal limits  CBC - Abnormal; Notable for the following:    WBC 14.7 (*)    Hemoglobin 10.2 (*)    HCT 33.0 (*)    MCV 65.9 (*)    MCH 20.4 (*)    MCHC 30.9 (*)    RDW 19.4 (*)    All other components within normal limits  URINALYSIS COMPLETEWITH MICROSCOPIC ([facility] ONLY) - Abnormal; Notable for the following:    Color, Urine STRAW (*)    APPearance CLEAR (*)    Specific Gravity, Urine 1.004 (*)    Bacteria, UA RARE (*)    Squamous Epithelial / LPF 0-5 (*)    All other components within normal limits  POC URINE PREG, ED  POCT PREGNANCY, URINE   ____________________________________________  EKG  None ____________________________________________  RADIOLOGY  US Abdomen Limited RUQ interpreted per Dr. Clovis Riley: Cholelithiasis and tenderness over the gallbladder. No sonographic mural thickening or pericholecystic  fluid. Probable mild hepatic fatty infiltration.  ____________________________________________   PROCEDURES  Procedure(s) performed: None  Critical Care performed: No  ____________________________________________   INITIAL IMPRESSION / ASSESSMENT AND PLAN / ED COURSE  Pertinent labs & imaging results that were available during my care of the patient were reviewed by me and considered in my medical decision making (see chart for details).  34 year old female who presents with right upper quadrant abdominal pain with a known history of cholelithiasis. Despite her normal LFTs and lipase, will obtain ultrasound to evaluate for cholecystitis as patient had vomiting this evening. Patient declines IV or IM analgesia.  ----------------------------------------- 4:02 AM on 05/28/2015 -----------------------------------------  Patient feeling much better. Updated patient and spouse of [place] results. Plan for analgesia, antiemetic and outpatient surgery follow-up. Strict return precautions given. Both verbalize understanding and agree with plan. ____________________________________________   FINAL CLINICAL IMPRESSION(S) / ED DIAGNOSES  Final diagnoses:  Biliary colic      Irean Hong, MD 16/10/96 [phone]

## 2015-05-28 NOTE — ED Notes (Signed)
Pt uprite on stretcher in exam room with no distress noted; husb at bedside; pt undressed self and placed hosp gown on; reports since 330p, today having right upper quad pain radiating into back accomp by nausea; st hx of same with dx gallstones; abd soft/nondist, tender to right upper quad

## 2015-05-28 NOTE — ED Notes (Signed)
Pt to u/s via stretcher with u/s tech

## 2015-06-03 ENCOUNTER — Encounter: Payer: Self-pay | Admitting: *Deleted

## 2015-06-03 ENCOUNTER — Ambulatory Visit: Payer: Self-pay | Admitting: General Surgery

## 2015-06-03 ENCOUNTER — Other Ambulatory Visit: Payer: Self-pay | Admitting: *Deleted

## 2015-06-05 DIAGNOSIS — D509 Iron deficiency anemia, unspecified: Secondary | ICD-10-CM | POA: Insufficient documentation

## 2016-01-21 ENCOUNTER — Encounter: Payer: Self-pay | Admitting: Emergency Medicine

## 2016-01-21 ENCOUNTER — Emergency Department
Admission: EM | Admit: 2016-01-21 | Discharge: 2016-01-21 | Disposition: A | Discharge status: Home / Self Care | Payer: Self-pay | Attending: Emergency Medicine | Admitting: Emergency Medicine

## 2016-01-21 DIAGNOSIS — Z79899 Other long term (current) drug therapy: Secondary | ICD-10-CM | POA: Insufficient documentation

## 2016-01-21 DIAGNOSIS — F17210 Nicotine dependence, cigarettes, uncomplicated: Secondary | ICD-10-CM | POA: Insufficient documentation

## 2016-01-21 DIAGNOSIS — M722 Plantar fascial fibromatosis: Secondary | ICD-10-CM | POA: Insufficient documentation

## 2016-01-21 DIAGNOSIS — B353 Tinea pedis: Secondary | ICD-10-CM | POA: Insufficient documentation

## 2016-01-21 MED ORDER — HYDROXYZINE PAMOATE 25 MG PO CAPS: 25.0000 mg | ORAL_CAPSULE | Freq: Three times a day (TID) | ORAL | Status: AC | PRN

## 2016-01-21 MED ORDER — NAPROXEN 500 MG PO TABS: 500.0000 mg | ORAL_TABLET | Freq: Two times a day (BID) | ORAL | Status: AC

## 2016-01-21 NOTE — ED Provider Notes (Signed)
[facility] Emergency Department Provider Note  ____________________________________________  Time seen: Approximately 1:24 PM  I have reviewed the triage vital signs and the nursing notes.   HISTORY  Chief Complaint Foot Pain    HPI Monique Jacobs is a 35 y.o. female, NAD, who presents to the emergency department with 10 day history of bilateral foot itching and sole pain. She had a pedicure yesterday in an attempt to alleviate her symptoms, with no relief. She reports her feet feeling hot and sweaty, that the itching keeps her up at night, and that her feet have been peeling a bit. The pain in soles of her feet is aggravated by pressure and relieved by rest. She denies walking barefoot outside, nor new cosmetic use. She usually wears tennis shoes with socks or flip flops. Denies any open wounds, lesions, oozing, weeping. Has not had any injury or trauma to the foot. Denies history of diabetes but does have history of Fibromyalgia.   Past Medical History  Diagnosis Date  . Ectopic pregnancy   . Anemia   . Cholelithiases     There are no active problems to display for this patient.   Past Surgical History  Procedure Laterality Date  . Tubal ligation    . Cessariatn section      Current Outpatient Rx  Name  Route  Sig  Dispense  Refill  . Albuterol Sulfate 108 (90 BASE) MCG/ACT AEPB   Inhalation   Inhale into the lungs.         . docusate sodium (COLACE) 100 MG capsule   Oral   Take 100 mg by mouth 2 (two) times daily.         . hydrOXYzine (VISTARIL) 25 MG capsule   Oral   Take 1 capsule (25 mg total) by mouth 3 (three) times daily as needed.   21 capsule   0   . ibuprofen (ADVIL,MOTRIN) 800 MG tablet   Oral   Take 800 mg by mouth.         . naproxen (NAPROSYN) 500 MG tablet   Oral   Take 1 tablet (500 mg total) by mouth 2 (two) times daily with a meal.   14 tablet   0   . oxyCODONE-acetaminophen (ROXICET) 5-325 MG tablet  Oral   Take by mouth.         . promethazine (PHENERGAN) 25 MG tablet   Oral   Take 25 mg by mouth.         . ranitidine ([facility] RANITIDINE) 75 MG tablet   Oral   Take 75 mg by mouth.         . senna (SENOKOT) 8.6 MG tablet   Oral   Take 2 tablets by mouth daily.         . traMADol (ULTRAM) 50 MG tablet   Oral   Take 1 tablet (50 mg total) by mouth every 6 (six) hours as needed.   12 tablet   0     Allergies Bactrim; Baclofen; Sumatriptan; and Vicodin  No family history on file.  Social History Social History  Substance Use Topics  . Smoking status: Current Every Day Smoker -- 1.00 packs/day    Types: Cigarettes  . Smokeless tobacco: None  . Alcohol Use: 0.6 oz/week    1 Standard drinks or equivalent per week     Review of Systems  Constitutional: No fever/chills Eyes: No visual changes.  Cardiovascular: No chest pain. Respiratory: No shortness of breath.  Musculoskeletal: Positive for pain to pressure of soles of feet bilaterally, non-radiating. Negative for back pain.  Skin: Reports itching of soles of feet bilaterally. Negative for rash. Neurological: Negative for headaches, focal weakness or numbness. No tingling 10-point ROS otherwise negative.  ____________________________________________   PHYSICAL EXAM:  VITAL SIGNS: ED Triage Vitals  Enc Vitals Group     BP 01/21/16 1238 136/69 mmHg     Pulse Rate 01/21/16 1238 88     Resp 01/21/16 1238 16     Temp 01/21/16 1238 97.7 F (36.5 C)     Temp Source 01/21/16 1238 Oral     SpO2 01/21/16 1238 98 %     Weight 01/21/16 1238 227 lb (102.967 kg)     Height 01/21/16 1238 4\' 11"  (1.499 m)     Head Cir --      Peak Flow --      Pain Score 01/21/16 1239 6     Pain Loc --      Pain Edu? --      Excl. in GC? --      Constitutional: Alert and oriented. Well appearing and in no acute distress. Eyes: Conjunctivae are normal.  Head: Atraumatic. Cardiovascular:  Good peripheral circulation with 2+  pulses about bilateral lower extremities. Respiratory: Normal respiratory effort without tachypnea or retractions.  Musculoskeletal:Pain to palpation of soles of feet bilaterally from ball to heel along the plantar fascia. No pain to palpation of ankles or achilles. No masses or discoloration. Full active and passive ROM with mild discomfort with dorsiflexion of feet bilaterally. No lower extremity tenderness nor edema.  No joint effusions. Neurologic:  Normal speech and language. No gross focal neurologic deficits are appreciated. Skin sensation to light touch grossly intact. Skin:  Mildly dry skin of soles of feet with mild hypopigmentation of interdigital web spaces, mild sheen/crepe-like look to inferior aspects of toes and balls of feet bilaterally. Mild peeling/flaking to skin at edges of soles medially and laterally. Skin is warm, dry. No rash, skin sores noted. Psychiatric: Mood and affect are normal. Speech and behavior are normal. Patient exhibits appropriate insight and judgement.   ____________________________________________   LABS  None ____________________________________________  EKG  None ____________________________________________  RADIOLOGY  None ____________________________________________    PROCEDURES  Procedure(s) performed: None   Medications - No data to display   ____________________________________________   INITIAL IMPRESSION / ASSESSMENT AND PLAN / ED COURSE  Patient's diagnosis is consistent with plantar fasciitis and tinea pedis of both feet. Patient will be discharged home with prescriptions for hydroxyzine and Naprosyn to take as directed. Patient advised to get over-the-counter spray, powder athlete's foot treatment to use as directed. Patient is to follow up with her primary care provider or [place] [facility] if symptoms persist past this treatment course. Patient is given ED precautions to return to the ED for any worsening or  new symptoms.   ____________________________________________  FINAL CLINICAL IMPRESSION(S) / ED DIAGNOSES  Final diagnoses:  Plantar fasciitis  Tinea pedis of both feet      NEW MEDICATIONS STARTED DURING THIS VISIT:  Discharge Medication List as of 01/21/2016  1:53 PM    START taking these medications   Details  hydrOXYzine (VISTARIL) 25 MG capsule Take 1 capsule (25 mg total) by mouth 3 (three) times daily as needed., Starting 01/21/2016, Until Discontinued, Print    naproxen (NAPROSYN) 500 MG tablet Take 1 tablet (500 mg total) by mouth 2 (two) times daily with a meal., Starting 01/21/2016, Until  Discontinued, Print             Hope Pigeon, PA-C 01/21/16 1442  Jene Every, MD 01/21/16 [phone]

## 2016-01-21 NOTE — Discharge Instructions (Signed)
Athlete's Foot Athlete's foot (tinea pedis) is a fungal infection of the skin on the feet. It often occurs on the skin between the toes or underneath the toes. It can also occur on the soles of the feet. Athlete's foot is more likely to occur in hot, humid weather. Not washing your feet or changing your socks often enough can contribute to athlete's foot. The infection can spread from person to person (contagious). CAUSES Athlete's foot is caused by a fungus. This fungus thrives in warm, moist places. Most people get athlete's foot by sharing shower stalls, towels, and wet floors with an infected person. People with weakened immune systems, including those with diabetes, may be more likely to get athlete's foot. SYMPTOMS   Itchy areas between the toes or on the soles of the feet.  White, flaky, or scaly areas between the toes or on the soles of the feet.  Tiny, intensely itchy blisters between the toes or on the soles of the feet.  Tiny cuts on the skin. These cuts can develop a bacterial infection.  Thick or discolored toenails. DIAGNOSIS  Your caregiver can usually tell what the problem is by doing a physical exam. Your caregiver may also take a skin sample from the rash area. The skin sample may be examined under a microscope, or it may be tested to see if fungus will grow in the sample. A sample may also be taken from your toenail for testing. TREATMENT  Over-the-counter and prescription medicines can be used to kill the fungus. These medicines are available as powders or creams. Your caregiver can suggest medicines for you. Fungal infections respond slowly to treatment. You may need to continue using your medicine for several weeks. PREVENTION   Do not share towels.  Wear sandals in wet areas, such as shared locker rooms and shared showers.  Keep your feet dry. Wear shoes that allow air to circulate. Wear cotton or wool socks. HOME CARE INSTRUCTIONS   Take medicines as directed by  your caregiver. Do not use steroid creams on athlete's foot.  Keep your feet clean and cool. Wash your feet daily and dry them thoroughly, especially between your toes.  Change your socks every day. Wear cotton or wool socks. In hot climates, you may need to change your socks 2 to 3 times per day.  Wear sandals or canvas tennis shoes with good air circulation.  If you have blisters, soak your feet in Burow's solution or Epsom salts for 20 to 30 minutes, 2 times a day to dry out the blisters. Make sure you dry your feet thoroughly afterward. SEEK MEDICAL CARE IF:   You have a fever.  You have swelling, soreness, warmth, or redness in your foot.  You are not getting better after 7 days of treatment.  You are not completely cured after 30 days.  You have any problems caused by your medicines. MAKE SURE YOU:   Understand these instructions.  Will watch your condition.  Will get help right away if you are not doing well or get worse.   This information is not intended to replace advice given to you by your health care provider. Make sure you discuss any questions you have with your health care provider.   Document Released: 08/05/2000 Document Revised: 10/31/2011 Document Reviewed: 02/09/2015 Elsevier Interactive Patient Education 2016 Else[employer] Inc.  Plantar Fasciitis Plantar fasciitis is a painful foot condition that affects the heel. It occurs when the band of tissue that connects the toes to the  heel bone (plantar fascia) becomes irritated. This can happen after exercising too much or doing other repetitive activities (overuse injury). The pain from plantar fasciitis can range from mild irritation to severe pain that makes it difficult for you to walk or move. The pain is usually worse in the morning or after you have been sitting or lying down for a while. CAUSES This condition may be caused by:  Standing for long periods of time.  Wearing shoes that do not fit.  Doing  high-impact activities, including running, aerobics, and ballet.  Being overweight.  Having an abnormal way of walking (gait).  Having tight calf muscles.  Having high arches in your feet.  Starting a new athletic activity. SYMPTOMS The main symptom of this condition is heel pain. Other symptoms include:  Pain that gets worse after activity or exercise.  Pain that is worse in the morning or after resting.  Pain that goes away after you walk for a few minutes. DIAGNOSIS This condition may be diagnosed based on your signs and symptoms. Your health care provider will also do a physical exam to check for:  A tender area on the bottom of your foot.  A high arch in your foot.  Pain when you move your foot.  Difficulty moving your foot. You may also need to have imaging studies to confirm the diagnosis. These can include:  X-rays.  Ultrasound.  MRI. TREATMENT  Treatment for plantar fasciitis depends on the severity of the condition. Your treatment may include:  Rest, ice, and over-the-counter pain medicines to manage your pain.  Exercises to stretch your calves and your plantar fascia.  A splint that holds your foot in a stretched, upward position while you sleep (night splint).  Physical therapy to relieve symptoms and prevent problems in the future.  Cortisone injections to relieve severe pain.  Extracorporeal shock wave therapy (ESWT) to stimulate damaged plantar fascia with electrical impulses. It is often used as a last resort before surgery.  Surgery, if other treatments have not worked after 12 months. HOME CARE INSTRUCTIONS  Take medicines only as directed by your health care provider.  Avoid activities that cause pain.  Roll the bottom of your foot over a bag of ice or a bottle of cold water. Do this for 20 minutes, 3-4 times a day.  Perform simple stretches as directed by your health care provider.  Try wearing athletic shoes with air-sole or gel-sole  cushions or soft shoe inserts.  Wear a night splint while sleeping, if directed by your health care provider.  Keep all follow-up appointments with your health care provider. PREVENTION   Do not perform exercises or activities that cause heel pain.  Consider finding low-impact activities if you continue to have problems.  Lose weight if you need to. The best way to prevent plantar fasciitis is to avoid the activities that aggravate your plantar fascia. SEEK MEDICAL CARE IF:  Your symptoms do not go away after treatment with home care measures.  Your pain gets worse.  Your pain affects your ability to move or do your daily activities.   This information is not intended to replace advice given to you by your health care provider. Make sure you discuss any questions you have with your health care provider.   Document Released: 05/03/2001 Document Revised: 04/29/2015 Document Reviewed: 06/18/2014 Elsevier Interactive Patient Education [employer] Else[employer] Inc.

## 2016-01-21 NOTE — ED Notes (Signed)
Pt reports bilateral foot pain and itching from arch of bilateral feet to heels. Pt reports pain and itching x1 week.

## 2019-07-25 ENCOUNTER — Other Ambulatory Visit: Payer: Self-pay

## 2019-07-25 DIAGNOSIS — Z20822 Contact with and (suspected) exposure to covid-19: Secondary | ICD-10-CM

## 2019-07-28 LAB — NOVEL CORONAVIRUS, NAA: SARS-CoV-2, NAA: NOT DETECTED

## 2020-04-26 ENCOUNTER — Emergency Department (HOSPITAL_COMMUNITY): Payer: Self-pay

## 2020-04-26 ENCOUNTER — Other Ambulatory Visit: Payer: Self-pay

## 2020-04-26 ENCOUNTER — Emergency Department (HOSPITAL_COMMUNITY)
Admission: EM | Admit: 2020-04-26 | Discharge: 2020-04-26 | Disposition: A | Discharge status: Home / Self Care | Payer: Self-pay | Attending: Emergency Medicine | Admitting: Emergency Medicine

## 2020-04-26 ENCOUNTER — Encounter (HOSPITAL_COMMUNITY): Payer: Self-pay

## 2020-04-26 DIAGNOSIS — F17210 Nicotine dependence, cigarettes, uncomplicated: Secondary | ICD-10-CM | POA: Insufficient documentation

## 2020-04-26 DIAGNOSIS — M549 Dorsalgia, unspecified: Secondary | ICD-10-CM | POA: Insufficient documentation

## 2020-04-26 DIAGNOSIS — S299XXA Unspecified injury of thorax, initial encounter: Secondary | ICD-10-CM | POA: Insufficient documentation

## 2020-04-26 DIAGNOSIS — Y92410 Unspecified street and highway as the place of occurrence of the external cause: Secondary | ICD-10-CM | POA: Insufficient documentation

## 2020-04-26 DIAGNOSIS — Z041 Encounter for examination and observation following transport accident: Secondary | ICD-10-CM | POA: Insufficient documentation

## 2020-04-26 DIAGNOSIS — Y9389 Activity, other specified: Secondary | ICD-10-CM | POA: Insufficient documentation

## 2020-04-26 DIAGNOSIS — M542 Cervicalgia: Secondary | ICD-10-CM | POA: Insufficient documentation

## 2020-04-26 DIAGNOSIS — Z79899 Other long term (current) drug therapy: Secondary | ICD-10-CM | POA: Insufficient documentation

## 2020-04-26 DIAGNOSIS — Y999 Unspecified external cause status: Secondary | ICD-10-CM | POA: Insufficient documentation

## 2020-04-26 LAB — COMPREHENSIVE METABOLIC PANEL
ALT: 14 U/L (ref 0–44)
AST: 17 U/L (ref 15–41)
Albumin: 3.5 g/dL (ref 3.5–5.0)
Alkaline Phosphatase: 89 U/L (ref 38–126)
Anion gap: 9 (ref 5–15)
BUN: 14 mg/dL (ref 6–20)
CO2: 23 mmol/L (ref 22–32)
Calcium: 8.6 mg/dL — ABNORMAL LOW (ref 8.9–10.3)
Chloride: 103 mmol/L (ref 98–111)
Creatinine, Ser: 0.8 mg/dL (ref 0.44–1.00)
GFR calc Af Amer: >60 mL/min (ref >60)
GFR calc non Af Amer: >60 mL/min (ref >60)
Glucose, Bld: 94 mg/dL (ref 70–99)
Potassium: 4.2 mmol/L (ref 3.5–5.1)
Sodium: 135 mmol/L (ref 135–145)
Total Bilirubin: 0.4 mg/dL (ref 0.3–1.2)
Total Protein: 7 g/dL (ref 6.5–8.1)

## 2020-04-26 LAB — CBC WITH DIFFERENTIAL/PLATELET
Abs Immature Granulocytes: 0.03 10*3/uL (ref 0.00–0.07)
Basophils Absolute: 0.1 10*3/uL (ref 0.0–0.1)
Basophils Relative: 1 %
Eosinophils Absolute: 0.3 10*3/uL (ref 0.0–0.5)
Eosinophils Relative: 3 %
HCT: 33.5 % — ABNORMAL LOW (ref 36.0–46.0)
Hemoglobin: 9.6 g/dL — ABNORMAL LOW (ref 12.0–15.0)
Immature Granulocytes: 0 %
Lymphocytes Relative: 32 %
Lymphs Abs: 3 10*3/uL (ref 0.7–4.0)
MCH: 20.6 pg — ABNORMAL LOW (ref 26.0–34.0)
MCHC: 28.7 g/dL — ABNORMAL LOW (ref 30.0–36.0)
MCV: 71.9 fL — ABNORMAL LOW (ref 80.0–100.0)
Monocytes Absolute: 0.7 10*3/uL (ref 0.1–1.0)
Monocytes Relative: 7 %
Neutro Abs: 5.3 10*3/uL (ref 1.7–7.7)
Neutrophils Relative %: 57 %
Platelets: 437 10*3/uL — ABNORMAL HIGH (ref 150–400)
RBC: 4.66 MIL/uL (ref 3.87–5.11)
RDW: 19.8 % — ABNORMAL HIGH (ref 11.5–15.5)
WBC: 9.3 10*3/uL (ref 4.0–10.5)
nRBC: 0 % (ref 0.0–0.2)

## 2020-04-26 LAB — HCG, QUANTITATIVE, PREGNANCY: hCG, Beta Chain, Quant, S: <1 m[IU]/mL (ref <5)

## 2020-04-26 MED ORDER — IOHEXOL 300 MG/ML  SOLN
75.0000 mL | Freq: Once | INTRAMUSCULAR | Status: AC | PRN
  Administered 2020-04-26: 75 mL via INTRAVENOUS

## 2020-04-26 MED ORDER — IBUPROFEN 800 MG PO TABS: 800.0000 mg | ORAL_TABLET | Freq: Three times a day (TID) | ORAL | 0 refills | Status: AC | PRN
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
## 2020-04-26 NOTE — Discharge Instructions (Signed)
Follow-up with your family doctor in a few days if not improving.
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
## 2020-12-01 ENCOUNTER — Other Ambulatory Visit: Payer: Self-pay

## 2020-12-01 DIAGNOSIS — H538 Other visual disturbances: Secondary | ICD-10-CM | POA: Insufficient documentation

## 2020-12-01 DIAGNOSIS — R42 Dizziness and giddiness: Secondary | ICD-10-CM | POA: Insufficient documentation

## 2020-12-01 DIAGNOSIS — R067 Sneezing: Secondary | ICD-10-CM | POA: Insufficient documentation

## 2020-12-01 DIAGNOSIS — R451 Restlessness and agitation: Secondary | ICD-10-CM | POA: Insufficient documentation

## 2020-12-01 DIAGNOSIS — F17210 Nicotine dependence, cigarettes, uncomplicated: Secondary | ICD-10-CM | POA: Insufficient documentation

## 2020-12-02 ENCOUNTER — Emergency Department (HOSPITAL_COMMUNITY)
Admission: EM | Admit: 2020-12-02 | Discharge: 2020-12-02 | Disposition: A | Discharge status: Home / Self Care | Payer: Self-pay | Attending: Emergency Medicine | Admitting: Emergency Medicine

## 2020-12-02 ENCOUNTER — Emergency Department (HOSPITAL_COMMUNITY): Payer: Self-pay

## 2020-12-02 ENCOUNTER — Other Ambulatory Visit: Payer: Self-pay

## 2020-12-02 ENCOUNTER — Encounter (HOSPITAL_COMMUNITY): Payer: Self-pay | Admitting: Emergency Medicine

## 2020-12-02 DIAGNOSIS — R42 Dizziness and giddiness: Secondary | ICD-10-CM

## 2020-12-02 LAB — BASIC METABOLIC PANEL
Anion gap: 10 (ref 5–15)
BUN: 11 mg/dL (ref 6–20)
CO2: 25 mmol/L (ref 22–32)
Calcium: 8.7 mg/dL — ABNORMAL LOW (ref 8.9–10.3)
Chloride: 103 mmol/L (ref 98–111)
Creatinine, Ser: 0.85 mg/dL (ref 0.44–1.00)
GFR, Estimated: >60 mL/min (ref >60)
Glucose, Bld: 108 mg/dL — ABNORMAL HIGH (ref 70–99)
Potassium: 3.6 mmol/L (ref 3.5–5.1)
Sodium: 138 mmol/L (ref 135–145)

## 2020-12-02 LAB — CBC
HCT: 31.1 % — ABNORMAL LOW (ref 36.0–46.0)
Hemoglobin: 8.9 g/dL — ABNORMAL LOW (ref 12.0–15.0)
MCH: 20 pg — ABNORMAL LOW (ref 26.0–34.0)
MCHC: 28.6 g/dL — ABNORMAL LOW (ref 30.0–36.0)
MCV: 69.7 fL — ABNORMAL LOW (ref 80.0–100.0)
Platelets: 381 10*3/uL (ref 150–400)
RBC: 4.46 MIL/uL (ref 3.87–5.11)
RDW: 19.7 % — ABNORMAL HIGH (ref 11.5–15.5)
WBC: 10.8 10*3/uL — ABNORMAL HIGH (ref 4.0–10.5)
nRBC: 0 % (ref 0.0–0.2)

## 2020-12-02 MED ORDER — SODIUM CHLORIDE 0.9 % IV BOLUS: 500.0000 mL | Freq: Once | INTRAVENOUS | Status: DC

## 2020-12-02 MED ORDER — IOHEXOL 350 MG/ML SOLN: 75.0000 mL | Freq: Once | INTRAVENOUS | Status: DC | PRN

## 2020-12-02 NOTE — ED Provider Notes (Signed)
[facility] EMERGENCY DEPARTMENT Provider Note   CSN: [id number] Arrival date & time: 12/01/20  2346     History Chief Complaint  Patient presents with  . Dizziness    Monique Jacobs is a 40 y.o. female.  Patient was instructed to come to the ER by her nurse line.  Patient reports that she had an episode where she sneezed approximately 20 times in a row.  This occurred 1 and half hours ago.  Immediately after the sneezing episode she started to feel dizzy and had blurred vision.  Vision is improved now but she still feels "weird".        Past Medical History:  Diagnosis Date  . Anemia   . Cholelithiases   . Ectopic pregnancy     Patient Active Problem List   Diagnosis Date Noted  . Iron (Fe) deficiency anemia 06/05/2015    Past Surgical History:  Procedure Laterality Date  . cessariatn section    . CHOLECYSTECTOMY    . TUBAL LIGATION       OB History   No obstetric history on file.     History reviewed. No pertinent family history.  Social History   Tobacco Use  . Smoking status: Current Every Day Smoker    Packs/day: 1.00    Types: Cigarettes  . Smokeless tobacco: Never Used  Substance Use Topics  . Alcohol use: Not Currently    Alcohol/week: 1.0 standard drink    Types: 1 Standard drinks or equivalent per week  . Drug use: Never    Home Medications Prior to Admission medications   Medication Sig Start Date End Date Taking? Authorizing Provider  albuterol (VENTOLIN HFA) 108 (90 Base) MCG/ACT inhaler Inhale into the lungs. 06/24/16  Yes [provider]  Dextromethorphan-Guaifenesin 60-1200 MG 12hr tablet Take by mouth. 11/04/16  Yes [provider]  EPINEPHrine 0.3 mg/0.3 mL IJ SOAJ injection Inject into the muscle. 05/14/16  Yes [provider]  famotidine (PEPCID) 20 MG tablet Take by mouth. 05/14/16  Yes [provider]  ibuprofen (ADVIL) 600 MG tablet Take by mouth. 08/12/18  Yes [provider]   medroxyPROGESTERone (PROVERA) 10 MG tablet Take 1 tablet by mouth daily. 07/01/18  Yes [provider]  oxyCODONE (OXY IR/ROXICODONE) 5 MG immediate release tablet Take by mouth. 08/25/15  Yes [provider]  Albuterol Sulfate 108 (90 BASE) MCG/ACT AEPB Inhale into the lungs.    [provider]  docusate sodium (COLACE) 100 MG capsule Take 100 mg by mouth 2 (two) times daily.    [provider]  doxycycline (VIBRAMYCIN) 100 MG capsule Take 100 mg by mouth 2 (two) times daily. 08/20/20   [provider]  hydrOXYzine (VISTARIL) 25 MG capsule Take 1 capsule (25 mg total) by mouth 3 (three) times daily as needed. 01/21/16   Hagler, Jami L, PA-C  ibuprofen (ADVIL) 800 MG tablet Take 1 tablet (800 mg total) by mouth every 8 (eight) hours as needed. 04/26/20   Bethann Berkshire, MD  naproxen (NAPROSYN) 500 MG tablet Take 1 tablet (500 mg total) by mouth 2 (two) times daily with a meal. 01/21/16   Hagler, Jami L, PA-C  oxyCODONE-acetaminophen (ROXICET) 5-325 MG tablet Take by mouth. 05/28/15   [provider]  predniSONE (DELTASONE) 20 MG tablet Take 40 mg by mouth daily. 08/20/20   [provider]  promethazine (PHENERGAN) 25 MG tablet Take 25 mg by mouth. 05/28/15   [provider]  ranitidine ([employer] RANITIDINE) 75 MG  tablet Take 75 mg by mouth.    [provider]  ranitidine (ZANTAC) 75 MG tablet Take by mouth.    [provider]  senna (SENOKOT) 8.6 MG tablet Take 2 tablets by mouth daily.    [provider]  traMADol (ULTRAM) 50 MG tablet Take 1 tablet (50 mg total) by mouth every 6 (six) hours as needed. 05/13/15   Rebecka Apley, MD    Allergies    Hydrocodone-acetaminophen, Sumatriptan, Sulfamethoxazole-trimethoprim, Vicodin [hydrocodone-acetaminophen], Baclofen, and Clotrimazole  Review of Systems   Review of Systems  HENT: Positive for sneezing.   Eyes: Positive for visual disturbance.  Neurological:  Positive for dizziness.  All other systems reviewed and are negative.   Physical Exam Updated Vital Signs BP 103/65   Pulse 80   Temp 97.8 F (36.6 C) (Oral)   Resp 20   Ht 4\' 11"  (1.499 m)   Wt 106.6 kg   LMP 10/30/2020   SpO2 100%   BMI 47.46 kg/m   Physical Exam Vitals and nursing note reviewed.  Constitutional:      General: She is not in acute distress.    Appearance: Normal appearance. She is well-developed.  HENT:     Head: Normocephalic and atraumatic.     Right Ear: Hearing normal.     Left Ear: Hearing normal.     Nose: Nose normal.  Eyes:     Conjunctiva/sclera: Conjunctivae normal.     Pupils: Pupils are equal, round, and reactive to light.  Cardiovascular:     Rate and Rhythm: Regular rhythm.     Heart sounds: S1 normal and S2 normal. No murmur heard. No friction rub. No gallop.   Pulmonary:     Effort: Pulmonary effort is normal. No respiratory distress.     Breath sounds: Normal breath sounds.  Chest:     Chest wall: No tenderness.  Abdominal:     General: Bowel sounds are normal.     Palpations: Abdomen is soft.     Tenderness: There is no abdominal tenderness. There is no guarding or rebound. Negative signs include Murphy's sign and McBurney's sign.     Hernia: No hernia is present.  Musculoskeletal:        General: Normal range of motion.     Cervical back: Normal range of motion and neck supple.  Skin:    General: Skin is warm and dry.     Findings: No rash.  Neurological:     Mental Status: She is alert and oriented to person, place, and time.     GCS: GCS eye subscore is 4. GCS verbal subscore is 5. GCS motor subscore is 6.     Cranial Nerves: No cranial nerve deficit.     Sensory: No sensory deficit.     Coordination: Coordination normal.     Comments: Extraocular muscle movement: normal No visual field cut Pupils: equal and reactive both direct and consensual response is normal No nystagmus present    Sensory function is intact to  light touch, pinprick Proprioception intact  Grip strength 5/5 symmetric in upper extremities No pronator drift Normal finger to nose bilaterally  Lower extremity strength 5/5 against gravity Normal heel to shin bilaterally  Gait: normal   Psychiatric:        Speech: Speech normal.        Behavior: Behavior normal.        Thought Content: Thought content normal.     ED Results / Procedures / Treatments  Labs (all labs ordered are listed, but only abnormal results are displayed) Labs Reviewed  CBC - Abnormal; Notable for the following components:      Result Value   WBC 10.8 (*)    Hemoglobin 8.9 (*)    HCT 31.1 (*)    MCV 69.7 (*)    MCH 20.0 (*)    MCHC 28.6 (*)    RDW 19.7 (*)    All other components within normal limits  BASIC METABOLIC PANEL - Abnormal; Notable for the following components:   Glucose, Bld 108 (*)    Calcium 8.7 (*)    All other components within normal limits    EKG None  Radiology No results found.  Procedures Procedures   Medications Ordered in ED Medications  sodium chloride 0.9 % bolus 500 mL (has no administration in time range)  iohexol (OMNIPAQUE) 350 MG/ML injection 75 mL (has no administration in time range)    ED Course  I have reviewed the triage vital signs and the nursing notes.  Pertinent labs & imaging results that were available during my care of the patient were reviewed by me and considered in my medical decision making (see chart for details).    MDM Rules/Calculators/A&P                          Patient presents to the emergency department for evaluation of acute onset of blurred vision and dizziness after sneezing multiple times.  She was concerned about the possibility of stroke.  She has a negative stroke screen, no focal deficits on examination.  Symptoms are improving.  CT angio head and neck to evaluate for possible aneurysm and carotid dissection as a result of forceful sneezing was ordered.  When patient  found out she needed an IV for this study, she became agitated.  I did talk to her and get her to consent to additional attempts to place an IV.  This was unsuccessful, after which the patient eloped.  Final Clinical Impression(s) / ED Diagnoses Final diagnoses:  Dizziness    Rx / DC Orders ED Discharge Orders    None       Anelis Hrivnak, Canary Brim, MD 12/02/20 [phone]

## 2020-12-02 NOTE — ED Notes (Signed)
Attempted IV x1, unsuccessful.  

## 2020-12-02 NOTE — ED Notes (Signed)
Pt has numerous questions regarding why her blood pressure was taken 4 times in a row, why she needs an iv, why she needs iv fluids, why she needs a ct scan. Attempted IV x1, unsuccessful. Pt states "were not doing this again". Dr. Blinda Leatherwood notified.

## 2020-12-02 NOTE — ED Triage Notes (Signed)
Pt c/o dizziness and blurred vision since sneezing 20 times. Pt states it has gotten a lot better.

## 2020-12-02 NOTE — ED Notes (Signed)
Pt states she is leaving and left department. Dr. Blinda Leatherwood notified.
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
## 2024-06-11 ENCOUNTER — Ambulatory Visit
Admission: RE | Admit: 2024-06-11 | Discharge: 2024-06-11 | Disposition: A | Discharge status: Home / Self Care | Payer: MEDICAID | Source: Ambulatory Visit | Attending: Primary Care | Admitting: Primary Care

## 2024-06-11 ENCOUNTER — Other Ambulatory Visit: Payer: Self-pay | Admitting: Primary Care

## 2024-06-11 DIAGNOSIS — M79645 Pain in left finger(s): Secondary | ICD-10-CM | POA: Insufficient documentation

## 2024-08-29 ENCOUNTER — Ambulatory Visit
Admission: EM | Admit: 2024-08-29 | Discharge: 2024-08-29 | Disposition: A | Discharge status: Home / Self Care | Payer: Self-pay | Attending: Emergency Medicine | Admitting: Emergency Medicine

## 2024-08-29 DIAGNOSIS — J069 Acute upper respiratory infection, unspecified: Secondary | ICD-10-CM

## 2024-08-29 LAB — POC COVID19/FLU A&B COMBO
Covid Antigen, POC: NEGATIVE
Influenza A Antigen, POC: NEGATIVE
Influenza B Antigen, POC: NEGATIVE

## 2024-08-29 MED ORDER — ALBUTEROL SULFATE HFA 108 (90 BASE) MCG/ACT IN AERS: 2.0000 | INHALATION_SPRAY | Freq: Four times a day (QID) | RESPIRATORY_TRACT | 0 refills | Status: AC | PRN

## 2024-08-29 MED ORDER — PROMETHAZINE-DM 6.25-15 MG/5ML PO SYRP: 5.0000 mL | ORAL_SOLUTION | Freq: Four times a day (QID) | ORAL | 0 refills | Status: AC | PRN

## 2024-08-29 NOTE — ED Triage Notes (Signed)
 Coughing nasal drainage body aches headache upper back pain x 2 days. States she has sick exposure but unsure if they have been tested for anything.   States yesterday her urine was dark but after drinking a lot of water her urine is clear today. Denis any urinary symptoms.   Patient tried ibuprofen  with little relief. No meds today for symptoms.

## 2024-08-29 NOTE — ED Provider Notes (Signed)
 " [facility]    CSN: [id number] Arrival date & time: 08/29/24  1838      History   Chief Complaint Chief Complaint  Patient presents with   Cough    HPI Monique Jacobs is a 44 y.o. female.   44 year old female, Monique Jacobs, presents to urgent care for evaluation of coughing, nasal drainage, body aches, headache, upper back pain for 2 days.  Patient reports known sick exposure, patient tried ibuprofen  yesterday without relief no meds today for symptoms.  Pt smokes ~ 1.5 ppd  The history is provided by the patient. No language interpreter was used.  Cough Associated symptoms: myalgias   Associated symptoms: no fever     Past Medical History:  Diagnosis Date   Anemia    Cholelithiases    Ectopic pregnancy     Patient Active Problem List   Diagnosis Date Noted   Viral URI with cough 08/29/2024   Iron (Fe) deficiency anemia 06/05/2015    Past Surgical History:  Procedure Laterality Date   cessariatn section     CHOLECYSTECTOMY     TUBAL LIGATION      OB History   No obstetric history on file.      Home Medications    Prior to Admission medications  Medication Sig Start Date End Date Taking? Authorizing Provider  promethazine -dextromethorphan (PROMETHAZINE -DM) 6.25-15 MG/5ML syrup Take 5 mLs by mouth 4 (four) times daily as needed for cough. 08/29/24  Yes Neldon Shepard, Rilla, NP  albuterol  (VENTOLIN  HFA) 108 (90 Base) MCG/ACT inhaler Inhale 2 puffs into the lungs every 6 (six) hours as needed for wheezing or shortness of breath. 08/29/24   Eulah Walkup, Rilla, NP  docusate sodium (COLACE) 100 MG capsule Take 100 mg by mouth 2 (two) times daily.    [provider]  doxycycline (VIBRAMYCIN) 100 MG capsule Take 100 mg by mouth 2 (two) times daily. 08/20/20   [provider]  EPINEPHrine 0.3 mg/0.3 mL IJ SOAJ injection Inject into the muscle. 05/14/16   [provider]  famotidine (PEPCID) 20 MG tablet Take by mouth. 05/14/16    [provider]  hydrOXYzine  (VISTARIL ) 25 MG capsule Take 1 capsule (25 mg total) by mouth 3 (three) times daily as needed. 01/21/16   Hagler, Jami L, PA-C  ibuprofen  (ADVIL ) 600 MG tablet Take by mouth. 08/12/18   [provider]  ibuprofen  (ADVIL ) 800 MG tablet Take 1 tablet (800 mg total) by mouth every 8 (eight) hours as needed. 04/26/20   Zammit, Joseph, MD  medroxyPROGESTERone (PROVERA) 10 MG tablet Take 1 tablet by mouth daily. 07/01/18   [provider]  naproxen  (NAPROSYN ) 500 MG tablet Take 1 tablet (500 mg total) by mouth 2 (two) times daily with a meal. 01/21/16   Hagler, Jami L, PA-C  oxyCODONE  (OXY IR/ROXICODONE ) 5 MG immediate release tablet Take by mouth. 08/25/15   [provider]  oxyCODONE -acetaminophen  (ROXICET) 5-325 MG tablet Take by mouth. 05/28/15   [provider]  predniSONE (DELTASONE) 20 MG tablet Take 40 mg by mouth daily. 08/20/20   [provider]  promethazine  (PHENERGAN ) 25 MG tablet Take 25 mg by mouth. 05/28/15   [provider]  ranitidine ([employer] RANITIDINE) 75 MG tablet Take 75 mg by mouth.    [provider]  ranitidine (ZANTAC) 75 MG tablet Take by mouth.    [provider]  senna (SENOKOT) 8.6 MG tablet Take 2 tablets by mouth daily.    [provider]  traMADol  (ULTRAM ) 50 MG tablet Take 1 tablet (50 mg total) by mouth every 6 (six) hours as needed. 05/13/15   Charlann Isaiah SQUIBB, MD    Family History History reviewed. No pertinent family history.  Social History Social History[1]   Allergies   Hydrocodone-acetaminophen , Sumatriptan, Sulfamethoxazole-trimethoprim, Vicodin [hydrocodone-acetaminophen ], Baclofen, and Clotrimazole    Review of Systems Review of Systems  Constitutional:  Negative for fever.  HENT:  Positive for congestion and postnasal drip.   Respiratory:  Positive for cough.   Musculoskeletal:  Positive for back pain and myalgias.  All other systems  reviewed and are negative.    Physical Exam Triage Vital Signs ED Triage Vitals  Encounter Vitals Group     BP 08/29/24 1857 119/80     Girls Systolic BP Percentile --      Girls Diastolic BP Percentile --      Boys Systolic BP Percentile --      Boys Diastolic BP Percentile --      Pulse Rate 08/29/24 1857 86     Resp 08/29/24 1857 18     Temp 08/29/24 1857 98.5 F (36.9 C)     Temp Source 08/29/24 1857 Oral     SpO2 08/29/24 1857 95 %     Weight 08/29/24 1856 245 lb (111.1 kg)     Height 08/29/24 1856 4' 11 (1.499 m)     Head Circumference --      Peak Flow --      Pain Score 08/29/24 1855 4     Pain Loc --      Pain Education --      Exclude from Growth Chart --    No data found.  Updated Vital Signs BP 119/80 (BP Location: Right Arm)   Pulse 86   Temp 98.5 F (36.9 C) (Oral)   Resp 18   Ht 4' 11 (1.499 m)   Wt 245 lb (111.1 kg)   LMP  (LMP Unknown)   SpO2 95%   BMI 49.48 kg/m   Visual Acuity Right Eye Distance:   Left Eye Distance:   Bilateral Distance:    Right Eye Near:   Left Eye Near:    Bilateral Near:     Physical Exam Vitals and nursing note reviewed.  Constitutional:      General: She is not in acute distress.    Appearance: She is well-developed and well-groomed.  HENT:     Head: Normocephalic.     Right Ear: Tympanic membrane is retracted.     Left Ear: Tympanic membrane is retracted.     Nose: Congestion present.     Mouth/Throat:     Lips: Pink.     Mouth: Mucous membranes are moist.     Pharynx: Oropharynx is clear.  Eyes:     General: Lids are normal.     Conjunctiva/sclera: Conjunctivae normal.     Pupils: Pupils are equal, round, and reactive to light.  Neck:     Trachea: No tracheal deviation.  Cardiovascular:     Rate and Rhythm: Normal rate and regular rhythm.     Heart sounds: Normal heart sounds. No murmur heard. Pulmonary:     Effort: Pulmonary effort is normal.     Breath sounds: Normal breath sounds and air  entry.  Abdominal:     General: Bowel sounds are normal.     Palpations: Abdomen is soft.     Tenderness: There is no abdominal tenderness.  Musculoskeletal:  General: Normal range of motion.     Cervical back: Normal range of motion.  Lymphadenopathy:     Cervical: No cervical adenopathy.  Skin:    General: Skin is warm and dry.     Findings: No rash.  Neurological:     General: No focal deficit present.     Mental Status: She is alert and oriented to person, place, and time.     GCS: GCS eye subscore is 4. GCS verbal subscore is 5. GCS motor subscore is 6.  Psychiatric:        Speech: Speech normal.        Behavior: Behavior normal. Behavior is cooperative.      UC Treatments / Results  Labs (all labs ordered are listed, but only abnormal results are displayed) Labs Reviewed  POC COVID19/FLU A&B COMBO - Normal    EKG   Radiology No results found.  Procedures Procedures (including critical care time)  Medications Ordered in UC Medications - No data to display  Initial Impression / Assessment and Plan / UC Course  I have reviewed the triage vital signs and the nursing notes.  Pertinent labs & imaging results that were available during my care of the patient were reviewed by me and considered in my medical decision making (see chart for details).  Clinical Course as of 08/29/24 1938  Thu Aug 29, 2024  1840 Flu COVID ordered for flulike symptoms x 2 days [JD]    Clinical Course User Index [JD] Nastasia Kage, Rilla, NP   Discussed exam findings and plan of care with patient : Covid and flu are negative, most likely this is a viral illness and will need to run its course Rest,push fluids,avoid caffeine Take phenergan  DM cough med as prescribed Albuterol  inhaler scripted,take as directed Stop smoking If you develop chest pain, shortness of breath, or worsening symptoms go to the emergency room for further evaluation.  Patient verbalized understanding to this  provider  Ddx: Viral URI with cough,smoker, allergies, COPD Final Clinical Impressions(s) / UC Diagnoses   Final diagnoses:  Viral URI with cough     Discharge Instructions      Covid and flu are negative, most likely this is a viral illness and will need to run its course Rest,push fluids,avoid caffeine Take phenergan  DM cough med as prescribed Albuterol  inhaler scripted,take as directed Stop smoking If you develop chest pain, shortness of breath, or worsening symptoms go to the emergency room for further evaluation      ED Prescriptions     Medication Sig Dispense Auth. Provider   albuterol  (VENTOLIN  HFA) 108 (90 Base) MCG/ACT inhaler Inhale 2 puffs into the lungs every 6 (six) hours as needed for wheezing or shortness of breath. 18 g Mariano Doshi, NP   promethazine -dextromethorphan (PROMETHAZINE -DM) 6.25-15 MG/5ML syrup Take 5 mLs by mouth 4 (four) times daily as needed for cough. 118 mL Stephanny Tsutsui, NP      PDMP not reviewed this encounter.     [1]  Social History Tobacco Use   Smoking status: Every Day    Current packs/day: 1.00    Types: Cigarettes   Smokeless tobacco: Never  Vaping Use   Vaping status: Never Used  Substance Use Topics   Alcohol use: Not Currently    Alcohol/week: 1.0 standard drink of alcohol    Types: 1 Standard drinks or equivalent per week   Drug use: Never     Aminta Rilla, NP 08/29/24 1938  "

## 2024-08-29 NOTE — Discharge Instructions (Addendum)
 Covid and flu are negative, most likely this is a viral illness and will need to run its course Rest,push fluids,avoid caffeine Take phenergan  DM cough med as prescribed Albuterol  inhaler scripted,take as directed Stop smoking If you develop chest pain, shortness of breath, or worsening symptoms go to the emergency room for further evaluation
